# Patient Record
Sex: Male | Born: 1975 | Race: White | Hispanic: No | Marital: Single | Smoking: Current some day smoker
Health system: Southern US, Community
[De-identification: ages and names within clinical notes are randomized; demographics above are authoritative.]

## PROBLEM LIST (undated history)

## (undated) DIAGNOSIS — M549 Dorsalgia, unspecified: Secondary | ICD-10-CM

## (undated) DIAGNOSIS — D1809 Hemangioma of other sites: Secondary | ICD-10-CM

## (undated) DIAGNOSIS — G43909 Migraine, unspecified, not intractable, without status migrainosus: Secondary | ICD-10-CM

## (undated) DIAGNOSIS — E7800 Pure hypercholesterolemia, unspecified: Secondary | ICD-10-CM

## (undated) DIAGNOSIS — D447 Neoplasm of uncertain behavior of aortic body and other paraganglia: Secondary | ICD-10-CM

## (undated) DIAGNOSIS — I10 Essential (primary) hypertension: Secondary | ICD-10-CM

## (undated) DIAGNOSIS — E119 Type 2 diabetes mellitus without complications: Secondary | ICD-10-CM

## (undated) DIAGNOSIS — Z91148 Patient's other noncompliance with medication regimen for other reason: Secondary | ICD-10-CM

## (undated) DIAGNOSIS — Z9114 Patient's other noncompliance with medication regimen: Secondary | ICD-10-CM

## (undated) DIAGNOSIS — F1110 Opioid abuse, uncomplicated: Secondary | ICD-10-CM

## (undated) DIAGNOSIS — F1910 Other psychoactive substance abuse, uncomplicated: Secondary | ICD-10-CM

## (undated) HISTORY — PX: OTHER SURGICAL HISTORY: SHX169

## (undated) HISTORY — DX: Essential (primary) hypertension: I10

## (undated) HISTORY — DX: Type 2 diabetes mellitus without complications: E11.9

## (undated) HISTORY — DX: Migraine, unspecified, not intractable, without status migrainosus: G43.909

## (undated) HISTORY — PX: INNER EAR SURGERY: SHX679

## (undated) HISTORY — DX: Pure hypercholesterolemia, unspecified: E78.00

---

## 1997-12-16 ENCOUNTER — Encounter (HOSPITAL_COMMUNITY): Admission: RE | Admit: 1997-12-16 | Discharge: 1998-03-16 | Payer: Self-pay

## 2010-04-09 ENCOUNTER — Emergency Department (HOSPITAL_COMMUNITY)
Admission: EM | Admit: 2010-04-09 | Discharge: 2010-04-09 | Payer: Self-pay | Source: Home / Self Care | Admitting: Emergency Medicine

## 2010-07-06 LAB — DIFFERENTIAL
Basophils Absolute: 0.1 10*3/uL (ref 0.0–0.1)
Eosinophils Relative: 0 % (ref 0–5)
Lymphocytes Relative: 9 % — ABNORMAL LOW (ref 12–46)
Neutro Abs: 12.1 10*3/uL — ABNORMAL HIGH (ref 1.7–7.7)
Neutrophils Relative %: 75 % (ref 43–77)

## 2010-07-06 LAB — URINE CULTURE: Culture  Setup Time: 201112152213

## 2010-07-06 LAB — STREP A DNA PROBE: Group A Strep Probe: NEGATIVE

## 2010-07-06 LAB — BASIC METABOLIC PANEL
CO2: 26 mEq/L (ref 19–32)
Chloride: 107 mEq/L (ref 96–112)
Glucose, Bld: 111 mg/dL — ABNORMAL HIGH (ref 70–99)
Potassium: 3.7 mEq/L (ref 3.5–5.1)
Sodium: 141 mEq/L (ref 135–145)

## 2010-07-06 LAB — URINALYSIS, ROUTINE W REFLEX MICROSCOPIC
Ketones, ur: NEGATIVE mg/dL
Leukocytes, UA: NEGATIVE
Nitrite: NEGATIVE
Protein, ur: 30 mg/dL — AB
pH: 7 (ref 5.0–8.0)

## 2010-07-06 LAB — CBC
HCT: 46.1 % (ref 39.0–52.0)
Hemoglobin: 16 g/dL (ref 13.0–17.0)
MCV: 91.8 fL (ref 78.0–100.0)
Platelets: 261 10*3/uL (ref 150–400)
RDW: 12.5 % (ref 11.5–15.5)

## 2010-07-06 LAB — URINE MICROSCOPIC-ADD ON

## 2010-07-06 LAB — RAPID STREP SCREEN (MED CTR MEBANE ONLY): Streptococcus, Group A Screen (Direct): NEGATIVE

## 2011-03-24 ENCOUNTER — Emergency Department (HOSPITAL_COMMUNITY)
Admission: EM | Admit: 2011-03-24 | Discharge: 2011-03-24 | Disposition: A | Discharge status: Home / Self Care | Payer: Self-pay | Attending: Emergency Medicine | Admitting: Emergency Medicine

## 2011-03-24 ENCOUNTER — Encounter: Payer: Self-pay | Admitting: *Deleted

## 2011-03-24 DIAGNOSIS — M545 Low back pain, unspecified: Secondary | ICD-10-CM | POA: Insufficient documentation

## 2011-03-24 DIAGNOSIS — F17200 Nicotine dependence, unspecified, uncomplicated: Secondary | ICD-10-CM | POA: Insufficient documentation

## 2011-03-24 DIAGNOSIS — M79604 Pain in right leg: Secondary | ICD-10-CM

## 2011-03-24 MED ORDER — CYCLOBENZAPRINE HCL 10 MG PO TABS
10.0000 mg | ORAL_TABLET | Freq: Once | ORAL | Status: AC
  Administered 2011-03-24: 10 mg via ORAL
  Filled 2011-03-24: qty 1

## 2011-03-24 MED ORDER — OXYCODONE-ACETAMINOPHEN 5-325 MG PO TABS
ORAL_TABLET | ORAL | Status: DC
Start: 2011-03-24 — End: 2011-07-20

## 2011-03-24 MED ORDER — IBUPROFEN 800 MG PO TABS: 800.0000 mg | ORAL_TABLET | Freq: Three times a day (TID) | ORAL | Status: DC

## 2011-03-24 MED ORDER — CYCLOBENZAPRINE HCL 10 MG PO TABS: 10.0000 mg | ORAL_TABLET | Freq: Two times a day (BID) | ORAL | Status: DC | PRN

## 2011-03-24 MED ORDER — OXYCODONE-ACETAMINOPHEN 5-325 MG PO TABS
1.0000 | ORAL_TABLET | Freq: Once | ORAL | Status: AC
  Administered 2011-03-24: 1 via ORAL
  Filled 2011-03-24: qty 1

## 2011-03-24 MED ORDER — IBUPROFEN 800 MG PO TABS
800.0000 mg | ORAL_TABLET | Freq: Once | ORAL | Status: AC
  Administered 2011-03-24: 800 mg via ORAL
  Filled 2011-03-24: qty 1

## 2011-03-24 NOTE — ED Notes (Signed)
Pt states "got in the shower & slipped this a.m., I've had back problems before, the pain is shooting down both my legs"; pt denies urinary/bowel incontinence

## 2011-03-24 NOTE — ED Provider Notes (Signed)
History     CSN: [id number] Arrival date & time: 03/24/2011  6:38 PM   First MD Initiated Contact with Patient 03/24/11 2104      Chief Complaint  Patient presents with  . Back Pain    (Consider location/radiation/quality/duration/timing/severity/associated sxs/prior treatment) Patient is a 35 y.o. male presenting with back pain. The history is provided by the patient.  Back Pain  This is a new problem. The problem occurs constantly. Associated with: He slipped in the shower this morning catching himself before a fall, jarring his back. He has had significant pain since that time to right lower back. The pain is present in the lumbar spine. The quality of the pain is described as stabbing and shooting. The pain radiates to the right thigh. The pain is moderate. The symptoms are aggravated by bending, twisting and certain positions. Pertinent negatives include no fever, no abdominal pain, no bowel incontinence, no bladder incontinence, no paresthesias and no weakness.    History reviewed. No pertinent past medical history.  Past Surgical History  Procedure Date  . Brain tumor removal     No family history on file.  History  Substance Use Topics  . Smoking status: Current Everyday Smoker -- 1.0 packs/day  . Smokeless tobacco: Not on file  . Alcohol Use: Yes     socially      Review of Systems  Constitutional: Negative for fever and chills.  HENT: Negative.   Respiratory: Negative.   Cardiovascular: Negative.   Gastrointestinal: Negative.  Negative for abdominal pain and bowel incontinence.  Genitourinary: Negative for bladder incontinence.  Musculoskeletal: Positive for back pain.       See HPI  Skin: Negative.   Neurological: Negative.  Negative for weakness and paresthesias.    Allergies  Review of patient's allergies indicates no known allergies.  Home Medications   Current Outpatient Rx  Name Route Sig Dispense Refill  . IBUPROFEN 100 MG PO TABS Oral Take  600 mg by mouth every 6 (six) hours as needed.        BP 166/91  Pulse 84  Temp(Src) 98.7 F (37.1 C) (Oral)  Resp 20  Wt 254 lb 6.6 oz (115.4 kg)  SpO2 95%  Physical Exam  Constitutional: He is oriented to person, place, and time. He appears well-developed and well-nourished.  Neck: Normal range of motion.  Pulmonary/Chest: Effort normal.  Abdominal: Soft. He exhibits no mass. There is no tenderness.  Musculoskeletal: Normal range of motion.       Right paralumbar tenderness without swelling, discoloration. No sciatic tenderness on right. Distal pulses 2+.  Neurological: He is alert and oriented to person, place, and time. He has normal reflexes. No sensory deficit.  Skin: Skin is warm and dry.  Psychiatric: He has a normal mood and affect.    ED Course  Procedures (including critical care time)  Labs Reviewed - No data to display No results found.   No diagnosis found.    MDM          Rodena Medin, PA 03/24/11 2148

## 2011-03-24 NOTE — ED Notes (Signed)
Patient stable upon discharge and discharged to care of family.

## 2011-03-25 NOTE — ED Provider Notes (Signed)
Medical screening examination/treatment/procedure(s) were performed by non-physician practitioner and as supervising physician I was immediately available for consultation/collaboration.  Cyndra Numbers, MD 03/25/11 [phone]

## 2011-03-27 ENCOUNTER — Emergency Department (HOSPITAL_COMMUNITY)
Admission: EM | Admit: 2011-03-27 | Discharge: 2011-03-28 | Disposition: A | Discharge status: Home / Self Care | Payer: Self-pay | Attending: Emergency Medicine | Admitting: Emergency Medicine

## 2011-03-27 ENCOUNTER — Encounter (HOSPITAL_COMMUNITY): Payer: Self-pay | Admitting: Emergency Medicine

## 2011-03-27 DIAGNOSIS — K5900 Constipation, unspecified: Secondary | ICD-10-CM | POA: Insufficient documentation

## 2011-03-27 DIAGNOSIS — W19XXXA Unspecified fall, initial encounter: Secondary | ICD-10-CM | POA: Insufficient documentation

## 2011-03-27 DIAGNOSIS — R5381 Other malaise: Secondary | ICD-10-CM | POA: Insufficient documentation

## 2011-03-27 DIAGNOSIS — R209 Unspecified disturbances of skin sensation: Secondary | ICD-10-CM | POA: Insufficient documentation

## 2011-03-27 DIAGNOSIS — M545 Low back pain, unspecified: Secondary | ICD-10-CM | POA: Insufficient documentation

## 2011-03-27 DIAGNOSIS — R5383 Other fatigue: Secondary | ICD-10-CM | POA: Insufficient documentation

## 2011-03-27 HISTORY — DX: Dorsalgia, unspecified: M54.9

## 2011-03-27 NOTE — ED Notes (Signed)
Pt reports low back pain radiating to B/L legs. Pt seen at [facility] for same on 03/24/2011. Prescribed meds not working. Pt reports swelling to left side of back.

## 2011-03-28 ENCOUNTER — Emergency Department (HOSPITAL_COMMUNITY): Payer: Self-pay

## 2011-03-28 MED ORDER — SODIUM CHLORIDE 0.9 % IV SOLN
Freq: Once | INTRAVENOUS | Status: AC
  Administered 2011-03-28: 01:00:00 via INTRAVENOUS

## 2011-03-28 MED ORDER — HYDROMORPHONE HCL PF 1 MG/ML IJ SOLN
0.5000 mg | Freq: Once | INTRAMUSCULAR | Status: AC
  Administered 2011-03-28: 01:00:00 via INTRAVENOUS
  Filled 2011-03-28: qty 1

## 2011-03-28 MED ORDER — KETOROLAC TROMETHAMINE 30 MG/ML IJ SOLN
30.0000 mg | Freq: Once | INTRAMUSCULAR | Status: AC
  Administered 2011-03-28: 30 mg via INTRAVENOUS
  Filled 2011-03-28: qty 1

## 2011-03-28 MED ORDER — DIAZEPAM 5 MG/ML IJ SOLN
5.0000 mg | Freq: Once | INTRAMUSCULAR | Status: AC
  Administered 2011-03-28: 01:00:00 via INTRAVENOUS
  Filled 2011-03-28: qty 2

## 2011-03-28 MED ORDER — DIAZEPAM 5 MG PO TABS: 5.0000 mg | ORAL_TABLET | Freq: Two times a day (BID) | ORAL | Status: AC

## 2011-03-28 MED ORDER — PREDNISONE 10 MG PO TABS: 20.0000 mg | ORAL_TABLET | Freq: Every day | ORAL | Status: AC

## 2011-03-28 NOTE — ED Provider Notes (Signed)
Medical screening examination/treatment/procedure(s) were performed by non-physician practitioner and as supervising physician I was immediately available for consultation/collaboration.  Zayyan Mullen, MD 03/28/11 0823 

## 2011-03-28 NOTE — ED Provider Notes (Signed)
History     CSN: [id number] Arrival date & time: 03/27/2011  7:05 PM   First MD Initiated Contact with Patient 03/27/11 2357      Chief Complaint  Patient presents with  . Back Pain    (Consider location/radiation/quality/duration/timing/severity/associated sxs/prior treatment) HPI Comments: This patient fell 3 days ago, was seen in the emergency room after the fall.  Prescribed Flexeril, Vicodin, and ibuprofen and has not had any relief of his discomfort.  States now the pain is shooting across bilaterally, with numbness and tingling into his legs.  Also reports constipation since starting the Flexeril and Vicodin.  No difficulty urinating  Patient is a 35 y.o. male presenting with back pain. The history is provided by the patient.  Back Pain  This is a recurrent problem. The current episode started more than 2 days ago. The problem occurs constantly. The problem has not changed since onset.The pain is associated with falling. The pain is present in the lumbar spine. The quality of the pain is described as aching. The pain radiates to the left thigh and right thigh. The pain is at a severity of 8/10. The pain is moderate. The symptoms are aggravated by certain positions. Associated symptoms include leg pain. Pertinent negatives include no bowel incontinence, no dysuria and no paresthesias. He has tried NSAIDs, analgesics and muscle relaxants for the symptoms. The treatment provided no relief.    Past Medical History  Diagnosis Date  . Back pain     Past Surgical History  Procedure Date  . Brain tumor removal     History reviewed. No pertinent family history.  History  Substance Use Topics  . Smoking status: Current Everyday Smoker -- 1.0 packs/day  . Smokeless tobacco: Not on file  . Alcohol Use: Yes     socially      Review of Systems  Constitutional: Positive for activity change.  HENT: Negative.   Eyes: Negative.   Respiratory: Negative.   Cardiovascular: Negative.    Gastrointestinal: Negative for diarrhea, constipation and bowel incontinence.  Genitourinary: Negative for dysuria.  Musculoskeletal: Positive for back pain.  Neurological: Negative for dizziness and paresthesias.  Hematological: Negative.   Psychiatric/Behavioral: Negative.     Allergies  Review of patient's allergies indicates no known allergies.  Home Medications   Current Outpatient Rx  Name Route Sig Dispense Refill  . OXYCODONE-ACETAMINOPHEN 5-325 MG PO TABS  One to two tablets q 4-6 hours prn pain 20 tablet 0  . DIAZEPAM 5 MG PO TABS Oral Take 1 tablet (5 mg total) by mouth 2 (two) times daily. 10 tablet 0  . PREDNISONE 10 MG PO TABS Oral Take 2 tablets (20 mg total) by mouth daily. 15 tablet 0    BP 149/102  Pulse 83  Temp(Src) 97.9 F (36.6 C) (Oral)  Resp 18  SpO2 94%  Physical Exam  ED Course  Procedures (including critical care time)  Labs Reviewed - No data to display Dg Lumbar Spine Complete  03/28/2011  *RADIOLOGY REPORT*  Clinical Data: Back pain after fall.  LUMBAR SPINE - COMPLETE 4+ VIEW  Comparison:  None.  Findings:  There is no evidence of lumbar spine fracture. Alignment is normal.  Intervertebral disc spaces are maintained.  IMPRESSION: Negative.  Original Report Authenticated By: Britta Mccreedy, M.D.     1. Lumbar back pain     Discussed changing medication importance of followup with orthopedic, Dr. August Saucer been given information to stop Flexeril stop ibuprofen.  Start Valium and prednisone  taper  MDM  Continued low back pain after fall.  Did not have x-ray at the time of injury.  Due to chronicity of symptoms.  Will x-ray.  I also will increase or change.  Pain control and muscle relaxers.  Will give trial dose IV in emergency room        Arman Filter, NP 03/28/11 0045  Arman Filter, NP 03/28/11 0405  Arman Filter, NP 03/28/11 4098

## 2011-06-06 ENCOUNTER — Encounter (HOSPITAL_BASED_OUTPATIENT_CLINIC_OR_DEPARTMENT_OTHER): Payer: Self-pay | Admitting: Emergency Medicine

## 2011-06-06 ENCOUNTER — Emergency Department (HOSPITAL_BASED_OUTPATIENT_CLINIC_OR_DEPARTMENT_OTHER)
Admission: EM | Admit: 2011-06-06 | Discharge: 2011-06-06 | Disposition: A | Discharge status: Home / Self Care | Payer: Self-pay | Attending: Emergency Medicine | Admitting: Emergency Medicine

## 2011-06-06 DIAGNOSIS — F17200 Nicotine dependence, unspecified, uncomplicated: Secondary | ICD-10-CM | POA: Insufficient documentation

## 2011-06-06 DIAGNOSIS — A6000 Herpesviral infection of urogenital system, unspecified: Secondary | ICD-10-CM | POA: Insufficient documentation

## 2011-06-06 DIAGNOSIS — N489 Disorder of penis, unspecified: Secondary | ICD-10-CM | POA: Insufficient documentation

## 2011-06-06 HISTORY — DX: Hemangioma of other sites: D18.09

## 2011-06-06 HISTORY — DX: Neoplasm of uncertain behavior of aortic body and other paraganglia: D44.7

## 2011-06-06 LAB — HIV ANTIBODY (ROUTINE TESTING W REFLEX): HIV: NONREACTIVE

## 2011-06-06 MED ORDER — OXYCODONE-ACETAMINOPHEN 5-325 MG PO TABS: 1.0000 | ORAL_TABLET | ORAL | Status: AC | PRN

## 2011-06-06 MED ORDER — VALACYCLOVIR HCL 1 G PO TABS: 1000.0000 mg | ORAL_TABLET | Freq: Two times a day (BID) | ORAL | Status: AC

## 2011-06-06 NOTE — ED Notes (Signed)
Pt states he has wound on foreskin which seems to form a scab and every time he voids, it comes off and causes pain.  Painful ambulation as well.  Fever x 3 days.  Pt noted some drainage at wound area.

## 2011-06-06 NOTE — ED Provider Notes (Signed)
History     CSN: [id number]  Arrival date & time 06/06/11  4098   First MD Initiated Contact with Patient 06/06/11 [phone]      No chief complaint on file.   (Consider location/radiation/quality/duration/timing/severity/associated sxs/prior treatment) The history is provided by the patient.   is noted painful sores on his penis for the last week. He thought might have been a yeast infection and used topical Monistat with no relief. There's been slight drainage. During this time, he has also run low grade fevers up to 101.5. He's had a mild sore throat. Denies cough or rhinorrhea denies nausea, vomiting, or diarrhea. He has had unprotected sex, but states he has only had sex with one woman. He states that she has had previous partners but has been tested for STDs. His pain is moderate to severe and worse with palpation.  Past Medical History  Diagnosis Date  . Back pain     Past Surgical History  Procedure Date  . Brain tumor removal     No family history on file.  History  Substance Use Topics  . Smoking status: Current Everyday Smoker -- 1.0 packs/day  . Smokeless tobacco: Not on file  . Alcohol Use: Yes     socially      Review of Systems  All other systems reviewed and are negative.    Allergies  Review of patient's allergies indicates no known allergies.  Home Medications   Current Outpatient Rx  Name Route Sig Dispense Refill  . OXYCODONE-ACETAMINOPHEN 5-325 MG PO TABS  One to two tablets q 4-6 hours prn pain 20 tablet 0    There were no vitals taken for this visit.  Physical Exam  Nursing note and vitals reviewed.  36 year old male who is resting comfortably and in no acute distress. Vital signs are significant for mild hypertension with blood pressure 147/101. Oxygen saturation is 96% which is normal. Head is normocephalic and atraumatic. PERRLA, EOMI. Oropharynx is clear. Neck is nontender and supple without adenopathy or JVD. Lungs are clear without  rales, wheezes, rhonchi. Back is nontender. Heart has regular rate and rhythm without murmur. There is no chest wall tenderness. Abdomen is soft, flat, nontender without masses or hepatosplenomegaly. Genitalia: There are vesicles noted on the prepuce of the penis with some purulent drainage and some mild swelling. There is no urethral discharge and the glans appears normal. There is no inguinal adenopathy. Picture seems most consistent with herpes genitalis. Extremities have no cyanosis or edema, full range of motion present. Skin is otherwise warm and dry without rash. Neurologic: Mental status is normal, cranial nerves are intact, there no focal motor or sensory deficits.  ED Course  Procedures (including critical care time)   1. Herpes genitalis       MDM  Most likely herpes genitalis. Patient doubts this diagnosis because he states his girlfriend has been tested for STDs. I pointed out to them that testing for STDs is not usually involved testing for herpes and that somebody can be infectious without being aware of the infection being present. Cultures have been sent as well as an STD panel. He will be. He be given a prescription for Valtrex and be given a prescription for Percocet for pain. He states that he has been circumcised, but he clearly has skin which goes up over the glans penis. He'll be referred to urology for evaluation as to whether a revision of circumcision would be beneficial.        Onalee Hua  Preston Fleeting, MD 06/06/11 [phone]

## 2011-06-09 LAB — HERPES SIMPLEX VIRUS CULTURE: Culture: DETECTED

## 2011-06-10 NOTE — ED Notes (Signed)
+   Herpes. Patient treated with Valtrex. [facility] faxed.

## 2011-06-11 ENCOUNTER — Ambulatory Visit: Payer: Self-pay | Admitting: Family Medicine

## 2011-06-11 VITALS — BP 146/87 | HR 78 | Temp 97.8°F | Resp 20

## 2011-06-11 DIAGNOSIS — R4781 Slurred speech: Secondary | ICD-10-CM

## 2011-06-11 DIAGNOSIS — E669 Obesity, unspecified: Secondary | ICD-10-CM

## 2011-06-11 DIAGNOSIS — IMO0001 Reserved for inherently not codable concepts without codable children: Secondary | ICD-10-CM

## 2011-06-11 DIAGNOSIS — R739 Hyperglycemia, unspecified: Secondary | ICD-10-CM

## 2011-06-11 DIAGNOSIS — N472 Paraphimosis: Secondary | ICD-10-CM

## 2011-06-11 DIAGNOSIS — G4730 Sleep apnea, unspecified: Secondary | ICD-10-CM

## 2011-06-11 LAB — COMPREHENSIVE METABOLIC PANEL
ALT: 83 U/L — ABNORMAL HIGH (ref 0–53)
AST: 28 U/L (ref 0–37)
Albumin: 4.1 g/dL (ref 3.5–5.2)
Calcium: 9.1 mg/dL (ref 8.4–10.5)
Chloride: 102 mEq/L (ref 96–112)
Creat: 0.65 mg/dL (ref 0.50–1.35)
Potassium: 4 mEq/L (ref 3.5–5.3)

## 2011-06-11 LAB — POCT CBC
Granulocyte percent: 49.6 %G (ref 37–80)
HCT, POC: 46.1 % (ref 43.5–53.7)
MCV: 90.6 fL (ref 80–97)
RBC: 5.09 M/uL (ref 4.69–6.13)

## 2011-06-11 LAB — POCT URINALYSIS DIPSTICK
Glucose, UA: 500
Spec Grav, UA: 1.02

## 2011-06-11 LAB — POCT GLYCOSYLATED HEMOGLOBIN (HGB A1C): Hemoglobin A1C: 9.5

## 2011-06-11 LAB — GLUCOSE, POCT (MANUAL RESULT ENTRY): POC Glucose: 262

## 2011-06-11 MED ORDER — METFORMIN HCL 500 MG PO TABS: ORAL_TABLET | ORAL | Status: DC

## 2011-06-11 MED ORDER — FLUCONAZOLE 100 MG PO TABS: ORAL_TABLET | ORAL | Status: DC

## 2011-06-11 NOTE — ED Notes (Signed)
Patient notified

## 2011-06-11 NOTE — Progress Notes (Signed)
Subjective:    Patient ID: Tyler Green, male    DOB: Jan 31, 1976, 36 y.o.   MRN: [id number]  HPI Today for the first time. He has a family history of diabetes. He has been using his father's glucose meter to check his sugars this week days ago his glucose ranged from the 200s to greater than 400 depending on whether he had eaten or not. He has never been treated for diabetes. Over the past couple of years she apparently has developed more problems. For the past year that has been increasing slurring of his speech. He has been having headaches and dizziness. Headaches primarily frontal.  He has had symptoms of sleep apnea with frequent periods of stopping breathing and his fiance has to waken him up.  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 Saw an oxycodone may have been making his sleep apnea worse.   Review of Systems No other major complaints. No chest pains or breathing problems. History of peripheral from a ladder and having bad ankles.    Objective:   Physical Exam Obese white male in no acute distress. He does speak with a little bit of a slurry, obtunded voice. TMs normal on the left the right is moist head is superior erythema. Apparently he has had surgeries in the past. His throat was clear. Neck supple without significant nodes. Chest is clear to auscultation. Heart regular without a murmurs. Abdomen soft without mass or tenderness. Penis has inflammation on the right side of the glans under the of the remaining foreskin. He has been cervicitis but the skin is still present down over the tip of the penis due to the size of him.  Results for orders placed in visit on 06/11/11  POCT CBC      Component Value Range   WBC 6.7  4.6 - 10.2 (K/uL)   Lymph, poc 2.7  0.6 - 3.4    POC LYMPH PERCENT 41.0  10 - 50 (%L)   MID (cbc) 0.6  0 - 0.9    POC MID % 9.4  0 - 12 (%M)   POC  Granulocyte 3.3  2 - 6.9    Granulocyte percent 49.6  37 - 80 (%G)   RBC 5.09  4.69 - 6.13 (M/uL)   Hemoglobin 15.4  14.1 - 18.1 (g/dL)   HCT, POC 78.2  95.6 - 53.7 (%)   MCV 90.6  80 - 97 (fL)   MCH, POC 30.3  27 - 31.2 (pg)   MCHC 33.4  31.8 - 35.4 (g/dL)   RDW, POC 21.3     Platelet Count, POC 269  142 - 424 (K/uL)   MPV 9.3  0 - 99.8 (fL)  POCT URINALYSIS DIPSTICK      Component Value Range   Color, UA yellow     Clarity, UA clear     Glucose, UA 500     Bilirubin, UA neg     Ketones, UA trace     Spec Grav, UA 1.020     Blood, UA trace     pH, UA 6.5     Protein, UA neg     Urobilinogen, UA 0.2     Nitrite, UA neg     Leukocytes, UA Negative    POCT GLYCOSYLATED HEMOGLOBIN (HGB A1C)      Component Value Range   Hemoglobin A1C 9.5  GLUCOSE, POCT (MANUAL RESULT ENTRY)      Component Value Range   POC Glucose 262         Assessment & Plan:  Hyperglycemia/type 2 diabetes uncontrolled  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 Symptoms of sleep apnea. Obesity Dizziness Headaches  We'll check basic labs and proceed from there.  I will begin treatment for the diabetes. I have explained that although the sleep apnea probably need to work up, we should try and see how he is feeling we get his sugars down slurred speech may need to be worked up also, but will wait on that 2 sisters been on for a long time. Taking the oxycodone for pain, for may be aggravating the sleep apnea symptoms. He does not need to be on any sedatives.

## 2011-06-11 NOTE — Patient Instructions (Addendum)
Read on the American Diabetic Association website about diabetes  Can also use some monistat vaginal or gynelotrimin on penis  Stop taking the oxycodone.

## 2011-06-15 ENCOUNTER — Encounter: Payer: Self-pay | Admitting: Family Medicine

## 2011-07-12 NOTE — ED Provider Notes (Signed)
History     CSN: [id number]  Arrival date & time 06/06/11  4098   First MD Initiated Contact with Patient 06/06/11 [phone]      Chief Complaint  Patient presents with  . Penis Pain  . Fever    (Consider location/radiation/quality/duration/timing/severity/associated sxs/prior treatment) HPI  Past Medical History  Diagnosis Date  . Back pain   . Glomus tumor of middle ear     Past Surgical History  Procedure Date  . Brain tumor removal   . Inner ear surgery     History reviewed. No pertinent family history.  History  Substance Use Topics  . Smoking status: Current Everyday Smoker -- 1.0 packs/day  . Smokeless tobacco: Not on file  . Alcohol Use: Yes     socially      Review of Systems  Allergies  Review of patient's allergies indicates no known allergies.  Home Medications   Current Outpatient Rx  Name Route Sig Dispense Refill  . FLUCONAZOLE 100 MG PO TABS  Use 1 daily for 5 days for yeast infection of penis 5 tablet 0  . METFORMIN HCL 500 MG PO TABS  Take 1 twice daily for 5 days, then 2 twice daily for diabetes. 120 tablet 3  . OXYCODONE-ACETAMINOPHEN 5-325 MG PO TABS  One to two tablets q 4-6 hours prn pain 20 tablet 0    BP 147/101  Pulse 75  Temp(Src) 98 F (36.7 C) (Oral)  Resp 20  SpO2 96%  Physical Exam  ED Course  Procedures (including critical care time)   Labs Reviewed  GC/CHLAMYDIA PROBE AMP, GENITAL  RPR  HIV ANTIBODY (ROUTINE TESTING)  HERPES SIMPLEX VIRUS CULTURE   No results found.   1. Herpes genitalis       MDM   herpes       Arman Filter, NP 07/18/11 2014

## 2011-07-20 ENCOUNTER — Emergency Department (HOSPITAL_BASED_OUTPATIENT_CLINIC_OR_DEPARTMENT_OTHER)
Admission: EM | Admit: 2011-07-20 | Discharge: 2011-07-20 | Disposition: A | Discharge status: Home / Self Care | Payer: Self-pay | Attending: Emergency Medicine | Admitting: Emergency Medicine

## 2011-07-20 ENCOUNTER — Encounter (HOSPITAL_BASED_OUTPATIENT_CLINIC_OR_DEPARTMENT_OTHER): Payer: Self-pay | Admitting: Emergency Medicine

## 2011-07-20 DIAGNOSIS — F17200 Nicotine dependence, unspecified, uncomplicated: Secondary | ICD-10-CM | POA: Insufficient documentation

## 2011-07-20 DIAGNOSIS — M545 Low back pain, unspecified: Secondary | ICD-10-CM | POA: Insufficient documentation

## 2011-07-20 DIAGNOSIS — E119 Type 2 diabetes mellitus without complications: Secondary | ICD-10-CM | POA: Insufficient documentation

## 2011-07-20 DIAGNOSIS — M549 Dorsalgia, unspecified: Secondary | ICD-10-CM

## 2011-07-20 MED ORDER — CYCLOBENZAPRINE HCL 10 MG PO TABS: 10.0000 mg | ORAL_TABLET | Freq: Two times a day (BID) | ORAL | Status: AC | PRN

## 2011-07-20 MED ORDER — KETOROLAC TROMETHAMINE 60 MG/2ML IM SOLN
60.0000 mg | Freq: Once | INTRAMUSCULAR | Status: AC
  Administered 2011-07-20: 60 mg via INTRAMUSCULAR
  Filled 2011-07-20: qty 2

## 2011-07-20 MED ORDER — HYDROCODONE-ACETAMINOPHEN 5-325 MG PO TABS: 2.0000 | ORAL_TABLET | ORAL | Status: AC | PRN

## 2011-07-20 NOTE — ED Provider Notes (Signed)
History     CSN: [id number]  Arrival date & time 07/20/11  Tyler Green   First MD Initiated Contact with Patient 07/20/11 1841      Chief Complaint  Patient presents with  . Back Pain    (Consider location/radiation/quality/duration/timing/severity/associated sxs/prior treatment) Patient is a 36 y.o. male presenting with back pain. The history is provided by the patient. No language interpreter was used.  Back Pain  This is a new problem. The current episode started more than 2 days ago. The problem occurs constantly. The problem has been gradually worsening. The pain is associated with lifting heavy objects. The pain is present in the lumbar spine. The quality of the pain is described as stabbing and shooting. The pain radiates to the left thigh and right thigh. The pain is at a severity of 9/10. The pain is severe. The symptoms are aggravated by bending. The pain is worse during the day. Stiffness is present in the morning. He has tried nothing for the symptoms. The treatment provided no relief.  Pt complains of pain in his low back.  Pt had similar 4 months ago.   Past Medical History  Diagnosis Date  . Back pain   . Glomus tumor of middle ear   . Diabetes mellitus     Past Surgical History  Procedure Date  . Brain tumor removal   . Inner ear surgery     History reviewed. No pertinent family history.  History  Substance Use Topics  . Smoking status: Current Everyday Smoker -- 1.0 packs/day  . Smokeless tobacco: Not on file  . Alcohol Use: Yes     socially      Review of Systems  Musculoskeletal: Positive for back pain.  All other systems reviewed and are negative.    Allergies  Review of patient's allergies indicates no known allergies.  Home Medications   Current Outpatient Rx  Name Route Sig Dispense Refill  . ACETAMINOPHEN 500 MG PO TABS Oral Take 1,000 mg by mouth once as needed. For pain    . SUPER B COMPLEX PO TABS Oral Take 1 tablet by mouth daily.    .  IBUPROFEN 200 MG PO TABS Oral Take 600-800 mg by mouth every 8 (eight) hours as needed. For pain    . METFORMIN HCL 500 MG PO TABS  Take 1 twice daily for 5 days, then 2 twice daily for diabetes. 120 tablet 3    BP 160/88  Pulse 86  Temp(Src) 98.1 F (36.7 C) (Oral)  Resp 16  Ht 5\' 6"  (1.676 m)  Wt 220 lb (99.791 kg)  BMI 35.51 kg/m2  SpO2 98%  Physical Exam  Nursing note and vitals reviewed. Constitutional: He is oriented to person, place, and time. He appears well-developed and well-nourished.  HENT:  Head: Normocephalic and atraumatic.  Right Ear: External ear normal.  Left Ear: External ear normal.  Nose: Nose normal.  Mouth/Throat: Oropharynx is clear and moist.  Eyes: Conjunctivae are normal. Pupils are equal, round, and reactive to light.  Neck: Normal range of motion. Neck supple.  Cardiovascular: Normal rate.   Pulmonary/Chest: Effort normal.  Abdominal: Soft.  Musculoskeletal: Normal range of motion.       Tender ls spine,  Decreased range of motion  Neurological: He is alert and oriented to person, place, and time. He has normal reflexes.  Skin: Skin is warm.  Psychiatric: He has a normal mood and affect.    ED Course  Procedures (including critical care time)  Labs Reviewed - No data to display No results found.   No diagnosis found.    MDM  Rx for vicodin and flexeril.  Referral to Dr. Pearletha Forge and hodgin       Tyler Green Tyler Green, [place] 07/20/11 Tyler Green

## 2011-07-20 NOTE — ED Notes (Signed)
Lower back pain since Friday.  Pt states he lifted furniture.  Pain radiates down legs, no elimination problems.

## 2011-07-20 NOTE — Discharge Instructions (Signed)
Back Pain, Adult Low back pain is very common. About 1 in 5 people have back pain.The cause of low back pain is rarely dangerous. The pain often gets better over time.About half of people with a sudden onset of back pain feel better in just 2 weeks. About 8 in 10 people feel better by 6 weeks.  CAUSES Some common causes of back pain include:  Strain of the muscles or ligaments supporting the spine.   Wear and tear (degeneration) of the spinal discs.   Arthritis.   Direct injury to the back.  DIAGNOSIS Most of the time, the direct cause of low back pain is not known.However, back pain can be treated effectively even when the exact cause of the pain is unknown.Answering your caregiver's questions about your overall health and symptoms is one of the most accurate ways to make sure the cause of your pain is not dangerous. If your caregiver needs more information, he or she may order lab work or imaging tests (X-rays or MRIs).However, even if imaging tests show changes in your back, this usually does not require surgery. HOME CARE INSTRUCTIONS For many people, back pain returns.Since low back pain is rarely dangerous, it is often a condition that people can learn to manageon their own.   Remain active. It is stressful on the back to sit or stand in one place. Do not sit, drive, or stand in one place for more than 30 minutes at a time. Take short walks on level surfaces as soon as pain allows.Try to increase the length of time you walk each day.   Do not stay in bed.Resting more than 1 or 2 days can delay your recovery.   Do not avoid exercise or work.Your body is made to move.It is not dangerous to be active, even though your back may hurt.Your back will likely heal faster if you return to being active before your pain is gone.   Pay attention to your body when you bend and lift. Many people have less discomfortwhen lifting if they bend their knees, keep the load close to their  bodies,and avoid twisting. Often, the most comfortable positions are those that put less stress on your recovering back.   Find a comfortable position to sleep. Use a firm mattress and lie on your side with your knees slightly bent. If you lie on your back, put a pillow under your knees.   Only take over-the-counter or prescription medicines as directed by your caregiver. Over-the-counter medicines to reduce pain and inflammation are often the most helpful.Your caregiver may prescribe muscle relaxant drugs.These medicines help dull your pain so you can more quickly return to your normal activities and healthy exercise.   Put ice on the injured area.   Put ice in a plastic bag.   Place a towel between your skin and the bag.   Leave the ice on for 15 to 20 minutes, 3 to 4 times a day for the first 2 to 3 days. After that, ice and heat may be alternated to reduce pain and spasms.   Ask your caregiver about trying back exercises and gentle massage. This may be of some benefit.   Avoid feeling anxious or stressed.Stress increases muscle tension and can worsen back pain.It is important to recognize when you are anxious or stressed and learn ways to manage it.Exercise is a great option.  SEEK MEDICAL CARE IF:  You have pain that is not relieved with rest or medicine.   You have   pain that does not improve in 1 week.   You have new symptoms.   You are generally not feeling well.  SEEK IMMEDIATE MEDICAL CARE IF:   You have pain that radiates from your back into your legs.   You develop new bowel or bladder control problems.   You have unusual weakness or numbness in your arms or legs.   You develop nausea or vomiting.   You develop abdominal pain.   You feel faint.  Document Released: 04/12/2005 Document Revised: 04/01/2011 Document Reviewed: 08/31/2010 [employer] Patient Information 2012 [employer]. 

## 2011-07-21 NOTE — ED Provider Notes (Signed)
Medical screening examination/treatment/procedure(s) were performed by non-physician practitioner and as supervising physician I was immediately available for consultation/collaboration.   Dione Booze, MD 07/21/11 1421

## 2011-07-21 NOTE — ED Provider Notes (Signed)
Medical screening examination/treatment/procedure(s) were performed by non-physician practitioner and as supervising physician I was immediately available for consultation/collaboration.   Jazon Jipson, MD 07/21/11 0846 

## 2011-11-09 ENCOUNTER — Other Ambulatory Visit: Payer: Self-pay | Admitting: Family Medicine

## 2011-12-23 ENCOUNTER — Other Ambulatory Visit: Payer: Self-pay | Admitting: Physician Assistant

## 2012-01-10 ENCOUNTER — Telehealth: Payer: Self-pay

## 2012-01-10 MED ORDER — METFORMIN HCL 500 MG PO TABS: 1000.0000 mg | ORAL_TABLET | Freq: Two times a day (BID) | ORAL | Status: DC

## 2012-01-10 NOTE — Telephone Encounter (Signed)
The patient's wife called because Tyler Green is about to run out of his metformin rx and he does not have money to come in for office visit per his pharmacy's instructions.  They are wondering what the next step should be, since he does need his medication, but cannot afford an office visit at this time.  Please call the patient or the patient's wife at [phone].

## 2012-01-10 NOTE — Telephone Encounter (Signed)
I spoke to his wife she advised they can not come in and will not be able to come in for some time, he is working at [employer] and does not make enough money for medical care. She is out of work on disability. They are aware he needs to be seen but do not know when he will be able to come in.

## 2012-01-10 NOTE — Telephone Encounter (Signed)
Patient notified

## 2012-01-10 NOTE — Telephone Encounter (Signed)
LMOM to CB. 

## 2012-01-10 NOTE — Telephone Encounter (Signed)
I called wife to determine when will he be able to come in to the office? He was advised last month of the need for office visit. I called the # given but did not get answer. Will try again later.

## 2012-01-10 NOTE — Telephone Encounter (Signed)
Please advise patient that I've sent in a 61-month supply with a refill.  However, he needs to be seen (last seen 05/2011) for any more.  He can set up a payment agreement or contact the [facility] [phone].

## 2012-06-25 ENCOUNTER — Encounter (HOSPITAL_BASED_OUTPATIENT_CLINIC_OR_DEPARTMENT_OTHER): Payer: Self-pay

## 2012-06-25 ENCOUNTER — Emergency Department (HOSPITAL_BASED_OUTPATIENT_CLINIC_OR_DEPARTMENT_OTHER)
Admission: EM | Admit: 2012-06-25 | Discharge: 2012-06-25 | Disposition: A | Discharge status: Home / Self Care | Payer: BC Managed Care – PPO | Attending: Emergency Medicine | Admitting: Emergency Medicine

## 2012-06-25 DIAGNOSIS — Y93G3 Activity, cooking and baking: Secondary | ICD-10-CM | POA: Insufficient documentation

## 2012-06-25 DIAGNOSIS — T2010XA Burn of first degree of head, face, and neck, unspecified site, initial encounter: Secondary | ICD-10-CM | POA: Insufficient documentation

## 2012-06-25 DIAGNOSIS — Z8669 Personal history of other diseases of the nervous system and sense organs: Secondary | ICD-10-CM | POA: Insufficient documentation

## 2012-06-25 DIAGNOSIS — T22119A Burn of first degree of unspecified forearm, initial encounter: Secondary | ICD-10-CM | POA: Insufficient documentation

## 2012-06-25 DIAGNOSIS — Z79899 Other long term (current) drug therapy: Secondary | ICD-10-CM | POA: Insufficient documentation

## 2012-06-25 DIAGNOSIS — E119 Type 2 diabetes mellitus without complications: Secondary | ICD-10-CM | POA: Insufficient documentation

## 2012-06-25 DIAGNOSIS — T2026XA Burn of second degree of forehead and cheek, initial encounter: Secondary | ICD-10-CM | POA: Insufficient documentation

## 2012-06-25 DIAGNOSIS — F17200 Nicotine dependence, unspecified, uncomplicated: Secondary | ICD-10-CM | POA: Insufficient documentation

## 2012-06-25 DIAGNOSIS — Z8739 Personal history of other diseases of the musculoskeletal system and connective tissue: Secondary | ICD-10-CM | POA: Insufficient documentation

## 2012-06-25 DIAGNOSIS — T2027XA Burn of second degree of neck, initial encounter: Secondary | ICD-10-CM | POA: Insufficient documentation

## 2012-06-25 DIAGNOSIS — Y9289 Other specified places as the place of occurrence of the external cause: Secondary | ICD-10-CM | POA: Insufficient documentation

## 2012-06-25 DIAGNOSIS — T2000XA Burn of unspecified degree of head, face, and neck, unspecified site, initial encounter: Secondary | ICD-10-CM

## 2012-06-25 DIAGNOSIS — X19XXXA Contact with other heat and hot substances, initial encounter: Secondary | ICD-10-CM | POA: Insufficient documentation

## 2012-06-25 MED ORDER — OXYCODONE-ACETAMINOPHEN 5-325 MG PO TABS: 2.0000 | ORAL_TABLET | ORAL | Status: DC | PRN

## 2012-06-25 MED ORDER — BACITRACIN ZINC 500 UNIT/GM EX OINT
TOPICAL_OINTMENT | Freq: Two times a day (BID) | CUTANEOUS | Status: DC
  Administered 2012-06-25: 1 via TOPICAL
  Filled 2012-06-25: qty 15

## 2012-06-25 MED ORDER — OXYCODONE-ACETAMINOPHEN 5-325 MG PO TABS
2.0000 | ORAL_TABLET | Freq: Once | ORAL | Status: AC
  Administered 2012-06-25: 2 via ORAL
  Filled 2012-06-25 (×2): qty 2
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
## 2012-06-28 ENCOUNTER — Encounter (HOSPITAL_BASED_OUTPATIENT_CLINIC_OR_DEPARTMENT_OTHER): Payer: Self-pay | Admitting: *Deleted

## 2012-06-28 ENCOUNTER — Emergency Department (HOSPITAL_BASED_OUTPATIENT_CLINIC_OR_DEPARTMENT_OTHER)
Admission: EM | Admit: 2012-06-28 | Discharge: 2012-06-28 | Disposition: A | Discharge status: Home / Self Care | Payer: BC Managed Care – PPO | Attending: Emergency Medicine | Admitting: Emergency Medicine

## 2012-06-28 DIAGNOSIS — E119 Type 2 diabetes mellitus without complications: Secondary | ICD-10-CM | POA: Insufficient documentation

## 2012-06-28 DIAGNOSIS — Z87828 Personal history of other (healed) physical injury and trauma: Secondary | ICD-10-CM | POA: Insufficient documentation

## 2012-06-28 DIAGNOSIS — Z79899 Other long term (current) drug therapy: Secondary | ICD-10-CM | POA: Insufficient documentation

## 2012-06-28 DIAGNOSIS — R51 Headache: Secondary | ICD-10-CM | POA: Insufficient documentation

## 2012-06-28 DIAGNOSIS — Z8669 Personal history of other diseases of the nervous system and sense organs: Secondary | ICD-10-CM | POA: Insufficient documentation

## 2012-06-28 DIAGNOSIS — F17200 Nicotine dependence, unspecified, uncomplicated: Secondary | ICD-10-CM | POA: Insufficient documentation

## 2012-06-28 DIAGNOSIS — Z4800 Encounter for change or removal of nonsurgical wound dressing: Secondary | ICD-10-CM | POA: Insufficient documentation

## 2012-06-28 DIAGNOSIS — Z8739 Personal history of other diseases of the musculoskeletal system and connective tissue: Secondary | ICD-10-CM | POA: Insufficient documentation

## 2012-06-28 MED ORDER — NAPROXEN 500 MG PO TABS: 500.0000 mg | ORAL_TABLET | Freq: Two times a day (BID) | ORAL | Status: DC

## 2012-06-28 MED ORDER — HYDROCODONE-ACETAMINOPHEN 5-325 MG PO TABS: 1.0000 | ORAL_TABLET | Freq: Four times a day (QID) | ORAL | Status: DC | PRN
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
## 2012-11-11 ENCOUNTER — Ambulatory Visit (INDEPENDENT_AMBULATORY_CARE_PROVIDER_SITE_OTHER): Payer: BC Managed Care – PPO | Admitting: Family Medicine

## 2012-11-11 VITALS — BP 120/80 | HR 79 | Temp 98.0°F | Resp 16 | Ht 66.0 in | Wt 179.0 lb

## 2012-11-11 DIAGNOSIS — M5431 Sciatica, right side: Secondary | ICD-10-CM

## 2012-11-11 DIAGNOSIS — M5416 Radiculopathy, lumbar region: Secondary | ICD-10-CM

## 2012-11-11 DIAGNOSIS — IMO0002 Reserved for concepts with insufficient information to code with codable children: Secondary | ICD-10-CM

## 2012-11-11 DIAGNOSIS — M5430 Sciatica, unspecified side: Secondary | ICD-10-CM

## 2012-11-11 MED ORDER — MELOXICAM 15 MG PO TABS: 15.0000 mg | ORAL_TABLET | Freq: Every day | ORAL | Status: DC

## 2012-11-11 MED ORDER — CYCLOBENZAPRINE HCL 10 MG PO TABS: 10.0000 mg | ORAL_TABLET | Freq: Three times a day (TID) | ORAL | Status: DC | PRN

## 2012-11-11 MED ORDER — HYDROCODONE-ACETAMINOPHEN 5-325 MG PO TABS: 1.0000 | ORAL_TABLET | Freq: Four times a day (QID) | ORAL | Status: DC | PRN

## 2012-11-11 NOTE — Progress Notes (Signed)
Subjective:    Patient ID: Tyler Green, male    DOB: 1975-12-27, 37 y.o.   MRN: [id number] Chief Complaint  Patient presents with  . Back Pain   HPI Pulled a lot of weeds yesterday.  Then spent the night sleeping on the couch and woke with severe back pain and stiffness shooting down right leg - worse in R > L but does seems to be going down both legs.  Has had back problems prior - slipped in the shower last yr and a few work injuries prior to that.  Always resolved w/ conservative treatment - anti-inflammatory, pain med, and ibuprofen and usu resolves w/in 2 wks.  Did have to do some PT last yr after fall in shower.   No numbness or weakness but pain is limiting his function.  No changes in bowels/bladder. No f/c. Took ibuprofen 400mg  po x 1 today w/o relief.  Just started following up with a new N.P. to care for his DM at [facility] and just got restarted on metformin - supposed to be doing blood glucose log now but hasn't been checking cbgs.  Past Medical History  Diagnosis Date  . Back pain   . Glomus tumor of middle ear   . Diabetes mellitus    Current Outpatient Prescriptions on File Prior to Visit  Medication Sig Dispense Refill  . acetaminophen (TYLENOL) 500 MG tablet Take 1,000 mg by mouth once as needed. For pain      . B Complex-C (SUPER B COMPLEX) TABS Take 1 tablet by mouth daily.      Marland Kitchen ibuprofen (ADVIL,MOTRIN) 200 MG tablet Take 600-800 mg by mouth every 8 (eight) hours as needed. For pain      . metFORMIN (GLUCOPHAGE) 500 MG tablet Take 2 tablets (1,000 mg total) by mouth 2 (two) times daily with a meal.  120 tablet  1   No current facility-administered medications on file prior to visit.   No Known Allergies  Review of Systems  Constitutional: Negative for fever and chills.  Gastrointestinal: Negative for abdominal pain, diarrhea and constipation.  Genitourinary: Negative for urgency, frequency, decreased urine volume and difficulty  urinating.  Musculoskeletal: Positive for myalgias and back pain. Negative for gait problem.  Skin: Negative for color change and wound.  Neurological: Negative for dizziness, weakness, light-headedness and numbness.      BP 120/80  Pulse 79  Temp(Src) 98 F (36.7 C) (Oral)  Resp 16  Ht 5\' 6"  (1.676 m)  Wt 179 lb (81.194 kg)  BMI 28.91 kg/m2  SpO2 97% Objective:   Physical Exam  Constitutional: He is oriented to person, place, and time. He appears well-developed and well-nourished. No distress.  HENT:  Head: Normocephalic and atraumatic.  Cardiovascular: Intact distal pulses.   Pulmonary/Chest: Effort normal.  Musculoskeletal: He exhibits tenderness. He exhibits no edema.       Thoracic back: He exhibits normal range of motion, no tenderness, no bony tenderness, no swelling, no deformity and no spasm.       Lumbar back: He exhibits decreased range of motion, tenderness, pain and spasm. He exhibits no bony tenderness, no edema and no deformity.  Supine straight leg raise + on the right w/ pain radiating to the right toes  Neurological: He is alert and oriented to person, place, and time. He has normal strength and normal reflexes. He displays no atrophy. No sensory deficit. He exhibits normal muscle tone. Coordination and gait normal.  Reflex Scores:  Patellar reflexes are 2+ on the right side and 2+ on the left side.      Achilles reflexes are 2+ on the right side and 2+ on the left side. Skin: Skin is warm and dry. No rash noted. He is not diaphoretic. No erythema.  Psychiatric: He has a normal mood and affect. His behavior is normal.          Assessment & Plan:  Sciatica, right - No steroids due to uncontrolled DM.  If pain continues, rec RTC to consider xray and PT referral.  Start meloxicam daily and continue until pain is completely resolved along with heat and gentle stretching. Continue with activity and use flexeril and norco prn.  Lumbar radiculopathy,  acute  Meds ordered this encounter  Medications  . HYDROcodone-acetaminophen (NORCO/VICODIN) 5-325 MG per tablet    Sig: Take 1 tablet by mouth every 6 (six) hours as needed for pain.    Dispense:  15 tablet    Refill:  0  . meloxicam (MOBIC) 15 MG tablet    Sig: Take 1 tablet (15 mg total) by mouth daily.    Dispense:  30 tablet    Refill:  1  . cyclobenzaprine (FLEXERIL) 10 MG tablet    Sig: Take 1 tablet (10 mg total) by mouth 3 (three) times daily as needed for muscle spasms.    Dispense:  30 tablet    Refill:  0

## 2012-11-11 NOTE — Patient Instructions (Addendum)

## 2013-01-25 ENCOUNTER — Emergency Department (HOSPITAL_BASED_OUTPATIENT_CLINIC_OR_DEPARTMENT_OTHER)
Admission: EM | Admit: 2013-01-25 | Discharge: 2013-01-25 | Disposition: A | Discharge status: Home / Self Care | Payer: BC Managed Care – PPO | Attending: Emergency Medicine | Admitting: Emergency Medicine

## 2013-01-25 ENCOUNTER — Encounter (HOSPITAL_BASED_OUTPATIENT_CLINIC_OR_DEPARTMENT_OTHER): Payer: Self-pay | Admitting: Emergency Medicine

## 2013-01-25 DIAGNOSIS — R739 Hyperglycemia, unspecified: Secondary | ICD-10-CM

## 2013-01-25 DIAGNOSIS — E119 Type 2 diabetes mellitus without complications: Secondary | ICD-10-CM | POA: Insufficient documentation

## 2013-01-25 DIAGNOSIS — Z791 Long term (current) use of non-steroidal anti-inflammatories (NSAID): Secondary | ICD-10-CM | POA: Insufficient documentation

## 2013-01-25 DIAGNOSIS — F17200 Nicotine dependence, unspecified, uncomplicated: Secondary | ICD-10-CM | POA: Insufficient documentation

## 2013-01-25 DIAGNOSIS — M5412 Radiculopathy, cervical region: Secondary | ICD-10-CM | POA: Insufficient documentation

## 2013-01-25 DIAGNOSIS — Z79899 Other long term (current) drug therapy: Secondary | ICD-10-CM | POA: Insufficient documentation

## 2013-01-25 DIAGNOSIS — Z85858 Personal history of malignant neoplasm of other endocrine glands: Secondary | ICD-10-CM | POA: Insufficient documentation

## 2013-01-25 DIAGNOSIS — M5431 Sciatica, right side: Secondary | ICD-10-CM

## 2013-01-25 HISTORY — DX: Patient's other noncompliance with medication regimen: Z91.14

## 2013-01-25 HISTORY — DX: Patient's other noncompliance with medication regimen for other reason: Z91.148

## 2013-01-25 LAB — BASIC METABOLIC PANEL
BUN: 14 mg/dL (ref 6–23)
Calcium: 9.6 mg/dL (ref 8.4–10.5)
Creatinine, Ser: 0.7 mg/dL (ref 0.50–1.35)
GFR calc Af Amer: >90 mL/min (ref >90)
GFR calc non Af Amer: >90 mL/min (ref >90)
Potassium: 3.9 mEq/L (ref 3.5–5.1)

## 2013-01-25 MED ORDER — HYDROMORPHONE HCL PF 1 MG/ML IJ SOLN
1.0000 mg | Freq: Once | INTRAMUSCULAR | Status: AC
  Administered 2013-01-25: 1 mg via INTRAVENOUS
  Filled 2013-01-25: qty 1

## 2013-01-25 MED ORDER — SODIUM CHLORIDE 0.9 % IV BOLUS (SEPSIS)
1000.0000 mL | Freq: Once | INTRAVENOUS | Status: AC
  Administered 2013-01-25: 1000 mL via INTRAVENOUS

## 2013-01-25 MED ORDER — HYDROCODONE-ACETAMINOPHEN 5-325 MG PO TABS: 1.0000 | ORAL_TABLET | Freq: Four times a day (QID) | ORAL | Status: DC | PRN

## 2013-01-25 MED ORDER — MELOXICAM 15 MG PO TABS: 15.0000 mg | ORAL_TABLET | Freq: Every day | ORAL | Status: DC

## 2013-01-25 MED ORDER — METFORMIN HCL 500 MG PO TABS: 1000.0000 mg | ORAL_TABLET | Freq: Two times a day (BID) | ORAL | Status: DC

## 2013-01-25 MED ORDER — LORAZEPAM 2 MG/ML IJ SOLN
0.5000 mg | Freq: Once | INTRAMUSCULAR | Status: AC
  Administered 2013-01-25: 0.5 mg via INTRAVENOUS
  Filled 2013-01-25: qty 1

## 2013-01-25 MED ORDER — CYCLOBENZAPRINE HCL 10 MG PO TABS: 10.0000 mg | ORAL_TABLET | Freq: Three times a day (TID) | ORAL | Status: DC | PRN

## 2013-01-25 MED ORDER — KETOROLAC TROMETHAMINE 30 MG/ML IJ SOLN
30.0000 mg | Freq: Once | INTRAMUSCULAR | Status: AC
  Administered 2013-01-25: 30 mg via INTRAVENOUS
  Filled 2013-01-25: qty 1

## 2013-01-25 NOTE — ED Notes (Signed)
Pt hyperventilating and writhing around in pain. MD at bedside. Pt c/o burning sensation in right knee. Pt states he is out of vicodin from previous visit for same.

## 2013-01-25 NOTE — ED Provider Notes (Signed)
CSN: [id number]     Arrival date & time 01/25/13  4098 History   First MD Initiated Contact with Patient 01/25/13 0341     Chief Complaint  Patient presents with  . Leg Pain   (Consider location/radiation/quality/duration/timing/severity/associated sxs/prior Treatment) Patient is a 37 y.o. male presenting with leg pain.  Leg Pain  Pt with history of DM and sciatica reports his right lower back has been bothering him since yesterday afternoon at work. About an hour ago while sleeping he was woken up with severe aching pain in R lower back radiating into his R leg. States he is having severe burning pain in knee and calf, waxes and wanes associated with cramping. No falls or injury, no bowel or bladder problems and no fever. He has been taking OTC Potassium supplements recently for muscle cramps but has never had his renal function or potassium levels checked.   Past Medical History  Diagnosis Date  . Back pain   . Glomus tumor of middle ear   . Diabetes mellitus    Past Surgical History  Procedure Laterality Date  . Brain tumor removal    . Inner ear surgery     Family History  Problem Relation Age of Onset  . Diabetes Mother   . Diabetes Father    History  Substance Use Topics  . Smoking status: Current Some Day Smoker -- 0.50 packs/day    Types: Cigarettes  . Smokeless tobacco: Not on file  . Alcohol Use: Yes     Comment: socially    Review of Systems All other systems reviewed and are negative except as noted in HPI.   Allergies  Review of patient's allergies indicates no known allergies.  Home Medications   Current Outpatient Rx  Name  Route  Sig  Dispense  Refill  . acetaminophen (TYLENOL) 500 MG tablet   Oral   Take 1,000 mg by mouth once as needed. For pain         . B Complex-C (SUPER B COMPLEX) TABS   Oral   Take 1 tablet by mouth daily.         . cyclobenzaprine (FLEXERIL) 10 MG tablet   Oral   Take 1 tablet (10 mg total) by mouth 3 (three) times  daily as needed for muscle spasms.   30 tablet   0   . HYDROcodone-acetaminophen (NORCO/VICODIN) 5-325 MG per tablet   Oral   Take 1 tablet by mouth every 6 (six) hours as needed for pain.   15 tablet   0   . ibuprofen (ADVIL,MOTRIN) 200 MG tablet   Oral   Take 600-800 mg by mouth every 8 (eight) hours as needed. For pain         . meloxicam (MOBIC) 15 MG tablet   Oral   Take 1 tablet (15 mg total) by mouth daily.   30 tablet   1   . metFORMIN (GLUCOPHAGE) 500 MG tablet   Oral   Take 2 tablets (1,000 mg total) by mouth 2 (two) times daily with a meal.   120 tablet   1    BP 146/106  Pulse 75  Temp(Src) 97.8 F (36.6 C) (Oral)  Resp 26  SpO2 100% Physical Exam  Nursing note and vitals reviewed. Constitutional: He is oriented to person, place, and time. He appears well-developed and well-nourished.  Writhing in bed, screaming and moaning  HENT:  Head: Normocephalic and atraumatic.  Eyes: EOM are normal. Pupils are equal, round, and  reactive to light.  Neck: Normal range of motion. Neck supple.  Cardiovascular: Normal rate, normal heart sounds and intact distal pulses.   Pulmonary/Chest: Effort normal and breath sounds normal.  Abdominal: Bowel sounds are normal. He exhibits no distension. There is no tenderness.  Musculoskeletal: Normal range of motion. He exhibits tenderness (tender R lower back, no midline tenderness; mild tenderness to R calf where he has some cramping). He exhibits no edema.  Neurological: He is alert and oriented to person, place, and time. He has normal strength. He displays normal reflexes. No cranial nerve deficit or sensory deficit.  Skin: Skin is warm and dry. No rash noted.  Psychiatric: He has a normal mood and affect.    ED Course  Procedures (including critical care time) Labs Review Labs Reviewed  BASIC METABOLIC PANEL - Abnormal; Notable for the following:    Glucose, Bld 475 (*)    All other components within normal limits    Imaging Review No results found.  MDM   1. Sciatica, right   2. Hyperglycemia without ketosis     Hyperglycemia without signs of DKA, renal dysfunction or hyperkalemia. His pain is improved, likely sciatica which has been a chronic problem for him. He is not a candidate for oral steroids due to uncontrolled DM. Will d/c with pain medications, NSAID, muscle relaxer and metformin refill. Advised PCP and Neurosurg eval for his pain.    Kjerstin Abrigo B. Bernette Mayers, MD 01/25/13 [phone]

## 2013-01-25 NOTE — ED Notes (Signed)
Pt laying on back with right leg straight to ceiling bending and straightening leg. No difficulty with right knee or hip ROM noted.

## 2013-01-25 NOTE — ED Notes (Signed)
Pt also reports he is out of his metformin for blood sugar.

## 2013-01-25 NOTE — ED Notes (Signed)
Pt c/o lower back pain which is recurrent starting yesterday and awoke tonight with right leg pain.

## 2014-03-20 ENCOUNTER — Encounter (HOSPITAL_BASED_OUTPATIENT_CLINIC_OR_DEPARTMENT_OTHER): Payer: Self-pay

## 2014-03-20 ENCOUNTER — Emergency Department (HOSPITAL_BASED_OUTPATIENT_CLINIC_OR_DEPARTMENT_OTHER)
Admission: EM | Admit: 2014-03-20 | Discharge: 2014-03-21 | Disposition: A | Discharge status: Home / Self Care | Payer: BC Managed Care – PPO | Attending: Emergency Medicine | Admitting: Emergency Medicine

## 2014-03-20 ENCOUNTER — Emergency Department (HOSPITAL_BASED_OUTPATIENT_CLINIC_OR_DEPARTMENT_OTHER): Payer: BC Managed Care – PPO

## 2014-03-20 DIAGNOSIS — Z791 Long term (current) use of non-steroidal anti-inflammatories (NSAID): Secondary | ICD-10-CM | POA: Insufficient documentation

## 2014-03-20 DIAGNOSIS — W01198A Fall on same level from slipping, tripping and stumbling with subsequent striking against other object, initial encounter: Secondary | ICD-10-CM | POA: Insufficient documentation

## 2014-03-20 DIAGNOSIS — Y998 Other external cause status: Secondary | ICD-10-CM | POA: Diagnosis not present

## 2014-03-20 DIAGNOSIS — E119 Type 2 diabetes mellitus without complications: Secondary | ICD-10-CM | POA: Diagnosis not present

## 2014-03-20 DIAGNOSIS — S20211A Contusion of right front wall of thorax, initial encounter: Secondary | ICD-10-CM | POA: Insufficient documentation

## 2014-03-20 DIAGNOSIS — Z9114 Patient's other noncompliance with medication regimen: Secondary | ICD-10-CM | POA: Insufficient documentation

## 2014-03-20 DIAGNOSIS — Z86018 Personal history of other benign neoplasm: Secondary | ICD-10-CM | POA: Insufficient documentation

## 2014-03-20 DIAGNOSIS — T1490XA Injury, unspecified, initial encounter: Secondary | ICD-10-CM

## 2014-03-20 DIAGNOSIS — Y9289 Other specified places as the place of occurrence of the external cause: Secondary | ICD-10-CM | POA: Diagnosis not present

## 2014-03-20 DIAGNOSIS — Z720 Tobacco use: Secondary | ICD-10-CM | POA: Insufficient documentation

## 2014-03-20 DIAGNOSIS — Y9302 Activity, running: Secondary | ICD-10-CM | POA: Insufficient documentation

## 2014-03-20 DIAGNOSIS — Z79899 Other long term (current) drug therapy: Secondary | ICD-10-CM | POA: Insufficient documentation

## 2014-03-20 DIAGNOSIS — S299XXA Unspecified injury of thorax, initial encounter: Secondary | ICD-10-CM | POA: Diagnosis present

## 2014-03-20 DIAGNOSIS — T148XXA Other injury of unspecified body region, initial encounter: Secondary | ICD-10-CM

## 2014-03-20 MED ORDER — KETOROLAC TROMETHAMINE 60 MG/2ML IM SOLN
60.0000 mg | Freq: Once | INTRAMUSCULAR | Status: AC
  Administered 2014-03-20: 60 mg via INTRAMUSCULAR
  Filled 2014-03-20: qty 2

## 2014-03-20 MED ORDER — MELOXICAM 7.5 MG PO TABS
7.5000 mg | ORAL_TABLET | Freq: Every day | ORAL | Status: DC
Start: 2014-03-20 — End: 2016-01-14

## 2014-03-20 MED ORDER — METHOCARBAMOL 500 MG PO TABS
1000.0000 mg | ORAL_TABLET | Freq: Once | ORAL | Status: AC
  Administered 2014-03-20: 1000 mg via ORAL
  Filled 2014-03-20: qty 2

## 2014-03-20 MED ORDER — METHOCARBAMOL 500 MG PO TABS: 500.0000 mg | ORAL_TABLET | Freq: Two times a day (BID) | ORAL | Status: DC

## 2014-03-20 NOTE — Discharge Instructions (Signed)
Contusion °A contusion is a deep bruise. Contusions happen when an injury causes bleeding under the skin. Signs of bruising include pain, puffiness (swelling), and discolored skin. The contusion may turn blue, purple, or yellow. °HOME CARE  °· Put ice on the injured area. °¨ Put ice in a plastic bag. °¨ Place a towel between your skin and the bag. °¨ Leave the ice on for 15-20 minutes, 03-04 times a day. °· Only take medicine as told by your doctor. °· Rest the injured area. °· If possible, raise (elevate) the injured area to lessen puffiness. °GET HELP RIGHT AWAY IF:  °· You have more bruising or puffiness. °· You have pain that is getting worse. °· Your puffiness or pain is not helped by medicine. °MAKE SURE YOU:  °· Understand these instructions. °· Will watch your condition. °· Will get help right away if you are not doing well or get worse. °Document Released: 09/29/2007 Document Revised: 07/05/2011 Document Reviewed: 02/15/2011 °[employer]® Patient Information ©2015 [employer]. This information is not intended to replace advice given to you by your health care provider. Make sure you discuss any questions you have with your health care provider. ° °

## 2014-03-20 NOTE — ED Notes (Signed)
Tripped fell 11/20-pain to right rib area

## 2014-03-20 NOTE — ED Provider Notes (Signed)
CSN: [id number]     Arrival date & time 03/20/14  2108 History  This chart was scribed for Adaly Puder Alfonso Patten, MD by Einar Pheasant, ED Scribe. This patient was seen in room MH04/MH04 and the patient's care was started at 11:33 PM.    Chief Complaint  Patient presents with  . Fall   Patient is a 38 y.o. male presenting with fall. The history is provided by the patient. No language interpreter was used.  Fall This is a new problem. The current episode started more than 2 days ago. The problem occurs constantly. The problem has not changed since onset.Pertinent negatives include no chest pain, no abdominal pain, no headaches and no shortness of breath. Nothing aggravates the symptoms. Nothing relieves the symptoms. He has tried acetaminophen for the symptoms. The treatment provided no relief.   HPI Comments: Tyler Green is a 38 y.o. male who presents to the Emergency Department complaining of a fall that occurred 5 days ago. Pt states that he was running across the yard when he fell and hit his right side. He is currently experiencing pain to the right rib region. Pt states that he's been tolerating the pain and taking tylenol and Ibuprofen without any significant relief. Denies any medicinal allergies, fever, chills  Past Medical History  Diagnosis Date  . Back pain   . Glomus tumor of middle ear   . Diabetes mellitus   . Non compliance w medication regimen    Past Surgical History  Procedure Laterality Date  . Brain tumor removal    . Inner ear surgery     Family History  Problem Relation Age of Onset  . Diabetes Mother   . Diabetes Father    History  Substance Use Topics  . Smoking status: Current Some Day Smoker -- 0.50 packs/day    Types: Cigarettes  . Smokeless tobacco: Not on file  . Alcohol Use: Yes     Comment: socially    Review of Systems  Respiratory: Negative for shortness of breath.   Cardiovascular: Negative for chest pain, palpitations and leg swelling.   Gastrointestinal: Negative for abdominal pain.  Musculoskeletal: Positive for arthralgias.  Neurological: Negative for headaches.  All other systems reviewed and are negative.     Allergies  Review of patient's allergies indicates no known allergies.  Home Medications   Prior to Admission medications   Medication Sig Start Date End Date Taking? Authorizing Provider  acetaminophen (TYLENOL) 500 MG tablet Take 1,000 mg by mouth once as needed. For pain    Historical Provider, MD  B Complex-C (SUPER B COMPLEX) TABS Take 1 tablet by mouth daily.    Historical Provider, MD  cyclobenzaprine (FLEXERIL) 10 MG tablet Take 1 tablet (10 mg total) by mouth 3 (three) times daily as needed for muscle spasms. 01/25/13   Charles B. Karle Starch, MD  HYDROcodone-acetaminophen (NORCO/VICODIN) 5-325 MG per tablet Take 1 tablet by mouth every 6 (six) hours as needed for pain. 01/25/13   Charles B. Karle Starch, MD  ibuprofen (ADVIL,MOTRIN) 200 MG tablet Take 600-800 mg by mouth every 8 (eight) hours as needed. For pain    Historical Provider, MD  meloxicam (MOBIC) 15 MG tablet Take 1 tablet (15 mg total) by mouth daily. 01/25/13   Charles B. Karle Starch, MD  metFORMIN (GLUCOPHAGE) 500 MG tablet Take 2 tablets (1,000 mg total) by mouth 2 (two) times daily with a meal. 01/25/13   Charles B. Karle Starch, MD   Triage Vitals: BP 178/105 mmHg  Pulse 79  Temp(Src) 99.3 F (37.4 C) (Oral)  Resp 16  Ht 5\' 7"  (1.702 m)  Wt 202 lb (91.627 kg)  BMI 31.63 kg/m2  SpO2 97%  Physical Exam  Constitutional: He is oriented to person, place, and time. He appears well-developed and well-nourished. No distress.  HENT:  Head: Normocephalic and atraumatic.  Right Ear: External ear normal.  Left Ear: External ear normal.  Nose: Nose normal.  Mouth/Throat: Oropharynx is clear and moist. No oropharyngeal exudate.  Eyes: Conjunctivae are normal. Pupils are equal, round, and reactive to light. Right eye exhibits no discharge. Left eye exhibits  no discharge.  Normal appearance  Neck: Normal range of motion. Neck supple. No thyromegaly present.  Cardiovascular: Normal rate, regular rhythm and normal heart sounds.  Exam reveals no gallop and no friction rub.   No murmur heard. Pulmonary/Chest: Effort normal and breath sounds normal. No respiratory distress. He has no wheezes. He has no rales. He exhibits no tenderness.  Abdominal: Soft. Bowel sounds are normal. He exhibits no distension. There is no tenderness. There is no rebound and no guarding.  Musculoskeletal: Normal range of motion. He exhibits no edema or tenderness.  No crepitus. No deformity.  Neurological: He is alert and oriented to person, place, and time.  Skin: Skin is warm and dry.  Psychiatric: He has a normal mood and affect. His behavior is normal. Thought content normal.  Nursing note and vitals reviewed.   ED Course  Procedures (including critical care time)  DIAGNOSTIC STUDIES: Oxygen Saturation is 97% on RA, adequate by my interpretation.    COORDINATION OF CARE: 11:35 PM- Will prescribe muscle relaxant. Pt advised of plan for treatment and pt agrees.   Medications  methocarbamol (ROBAXIN) tablet 1,000 mg (not administered)  ketorolac (TORADOL) injection 60 mg (not administered)     Imaging Review Dg Ribs Unilateral W/chest Right  03/20/2014   CLINICAL DATA:  Right lower rib pain with some SOB after falling on tree stump 4 days ago  EXAM: RIGHT RIBS AND CHEST - 3+ VIEW  COMPARISON:  04/09/2010  FINDINGS: No fracture or other bone lesions are seen involving the ribs. There is no evidence of pneumothorax or pleural effusion. Both lungs are clear. Heart size and mediastinal contours are within normal limits.  IMPRESSION: Negative.   Electronically Signed   By: Kerby Moors M.D.   On: 03/20/2014 22:21      MDM   Final diagnoses:  Injury    Symptoms consistent with contusion.  Mobic and muscle relaxants for pain.    I personally performed the  services described in this documentation, which was scribed in my presence. The recorded information has been reviewed and is accurate.    Emilio Baylock Alfonso Patten, MD 03/21/14 0530

## 2014-03-21 ENCOUNTER — Encounter (HOSPITAL_BASED_OUTPATIENT_CLINIC_OR_DEPARTMENT_OTHER): Payer: Self-pay | Admitting: Emergency Medicine

## 2014-08-10 ENCOUNTER — Emergency Department (HOSPITAL_BASED_OUTPATIENT_CLINIC_OR_DEPARTMENT_OTHER): Payer: BLUE CROSS/BLUE SHIELD

## 2014-08-10 ENCOUNTER — Encounter (HOSPITAL_BASED_OUTPATIENT_CLINIC_OR_DEPARTMENT_OTHER): Payer: Self-pay | Admitting: Emergency Medicine

## 2014-08-10 ENCOUNTER — Emergency Department (HOSPITAL_BASED_OUTPATIENT_CLINIC_OR_DEPARTMENT_OTHER)
Admission: EM | Admit: 2014-08-10 | Discharge: 2014-08-11 | Disposition: A | Discharge status: Home / Self Care | Payer: BLUE CROSS/BLUE SHIELD | Attending: Emergency Medicine | Admitting: Emergency Medicine

## 2014-08-10 DIAGNOSIS — Z720 Tobacco use: Secondary | ICD-10-CM | POA: Diagnosis not present

## 2014-08-10 DIAGNOSIS — Z79899 Other long term (current) drug therapy: Secondary | ICD-10-CM | POA: Diagnosis not present

## 2014-08-10 DIAGNOSIS — M79671 Pain in right foot: Secondary | ICD-10-CM | POA: Insufficient documentation

## 2014-08-10 DIAGNOSIS — E119 Type 2 diabetes mellitus without complications: Secondary | ICD-10-CM | POA: Insufficient documentation

## 2014-08-10 DIAGNOSIS — Z9119 Patient's noncompliance with other medical treatment and regimen: Secondary | ICD-10-CM | POA: Insufficient documentation

## 2014-08-10 DIAGNOSIS — Z791 Long term (current) use of non-steroidal anti-inflammatories (NSAID): Secondary | ICD-10-CM | POA: Diagnosis not present

## 2014-08-10 DIAGNOSIS — M7989 Other specified soft tissue disorders: Secondary | ICD-10-CM | POA: Diagnosis present

## 2014-08-10 LAB — CBG MONITORING, ED: Glucose-Capillary: 176 mg/dL — ABNORMAL HIGH (ref 70–99)

## 2014-08-10 MED ORDER — CLINDAMYCIN HCL 150 MG PO CAPS
300.0000 mg | ORAL_CAPSULE | Freq: Once | ORAL | Status: AC
  Administered 2014-08-10: 300 mg via ORAL
  Filled 2014-08-10: qty 2

## 2014-08-10 MED ORDER — CLINDAMYCIN HCL 150 MG PO CAPS: 300.0000 mg | ORAL_CAPSULE | Freq: Four times a day (QID) | ORAL | Status: DC

## 2014-08-10 MED ORDER — OXYCODONE-ACETAMINOPHEN 5-325 MG PO TABS: 1.0000 | ORAL_TABLET | Freq: Four times a day (QID) | ORAL | Status: DC | PRN

## 2014-08-10 MED ORDER — OXYCODONE-ACETAMINOPHEN 5-325 MG PO TABS
1.0000 | ORAL_TABLET | Freq: Once | ORAL | Status: AC
  Administered 2014-08-10: 1 via ORAL
  Filled 2014-08-10: qty 1

## 2014-08-10 NOTE — ED Provider Notes (Signed)
CSN: [id number]     Arrival date & time 08/10/14  2113 History  This chart was scribed for Tyler Beers, MD by Chester Holstein, ED Scribe. This patient was seen in room MH06/MH06 and the patient's care was started at 10:34 PM.    Chief Complaint  Patient presents with  . Foot Swelling     HPI HPI Comments: [name] is a 39 y.o. male with PMHx of DM who presents to the Emergency Department complaining of swelling to right foot with onset 2 days ago. Pt states he walked home bare footed for 5 miles 2 weeks ago. Pt states bilateral feet blistered, however left foot has healed. Pt notes associated redness to medial aspect of foot, which worsened and spread to his ankle yesterday, and blisters to ball and heel of foot. He notes severe pain with weight bearing. 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 Pt states CBG has ranged from 119-128 in past week. Pt denies break in skin, fever and numbness.   Past Medical History  Diagnosis Date  . Back pain   . Glomus tumor of middle ear   . Diabetes mellitus   . Non compliance w medication regimen    Past Surgical History  Procedure Laterality Date  . Brain tumor removal    . Inner ear surgery     Family History  Problem Relation Age of Onset  . Diabetes Mother   . Diabetes Father    History  Substance Use Topics  . Smoking status: Current Some Day Smoker -- 0.50 packs/day    Types: Cigarettes  . Smokeless tobacco: Not on file  . Alcohol Use: Yes     Comment: socially    Review of Systems  Constitutional: Negative for fever.  Musculoskeletal: Positive for myalgias.  Skin: Positive for color change (redness). Negative for wound.       blisters  Neurological: Negative for numbness.   ROS reviewed and all otherwise negative except for mentioned in HPI   Allergies  Review of patient's allergies indicates no known allergies.  Home Medications   Prior to Admission medications    Medication Sig Start Date End Date Taking? Authorizing Provider  acetaminophen (TYLENOL) 500 MG tablet Take 1,000 mg by mouth once as needed. For pain    Historical Provider, MD  B Complex-C (SUPER B COMPLEX) TABS Take 1 tablet by mouth daily.    Historical Provider, MD  clindamycin (CLEOCIN) 150 MG capsule Take 2 capsules (300 mg total) by mouth every 6 (six) hours. 08/10/14   Tyler Beers, MD  cyclobenzaprine (FLEXERIL) 10 MG tablet Take 1 tablet (10 mg total) by mouth 3 (three) times daily as needed for muscle spasms. 01/25/13   Calvert Cantor, MD  HYDROcodone-acetaminophen (NORCO/VICODIN) 5-325 MG per tablet Take 1 tablet by mouth every 6 (six) hours as needed for pain. 01/25/13   Calvert Cantor, MD  ibuprofen (ADVIL,MOTRIN) 200 MG tablet Take 600-800 mg by mouth every 8 (eight) hours as needed. For pain    Historical Provider, MD  meloxicam (MOBIC) 15 MG tablet Take 1 tablet (15 mg total) by mouth daily. 01/25/13   Calvert Cantor, MD  meloxicam (MOBIC) 7.5 MG tablet Take 1 tablet (7.5 mg total) by mouth daily. 03/20/14   April Palumbo, MD  metFORMIN (GLUCOPHAGE) 500 MG tablet Take 2 tablets (1,000 mg total) by mouth 2 (two) times daily with a meal. 01/25/13   Calvert Cantor, MD  methocarbamol (  ROBAXIN) 500 MG tablet Take 1 tablet (500 mg total) by mouth 2 (two) times daily. 03/20/14   April Palumbo, MD  oxyCODONE-acetaminophen (PERCOCET/ROXICET) 5-325 MG per tablet Take 1-2 tablets by mouth every 6 (six) hours as needed for severe pain. 08/10/14   Tyler Beers, MD   BP 156/99 mmHg  Pulse 76  Temp(Src) 98.7 F (37.1 C) (Oral)  Resp 18  Ht 5\' 8"  (1.727 m)  Wt 210 lb (95.255 kg)  BMI 31.94 kg/m2  SpO2 97% Physical Exam  Constitutional: He is oriented to person, place, and time. He appears well-developed and well-nourished.  HENT:  Head: Normocephalic.  Eyes: Conjunctivae are normal.  Neck: Normal range of motion. Neck supple.  Pulmonary/Chest: Effort normal.  Musculoskeletal: Normal  range of motion.  Neurological: He is alert and oriented to person, place, and time.  Skin: Skin is warm and dry.  Psychiatric: He has a normal mood and affect. His behavior is normal.  Nursing note and vitals reviewed. Physical Examination: General appearance - alert, well appearing, and in no distress Mental status - alert, oriented to person, place, and time Eyes - no conjunctival injection, no scleral icterus Chest - clear to auscultation, no wheezes, rales or rhonchi, symmetric air entry Heart - normal rate, regular rhythm, normal S1, S2, no murmurs, rubs, clicks or gallops Neurological - alert, oriented, normal speech, sensation intact in feet and toes bilaterally Musculoskeletal - , ttp over arch of right foot and heel, otherwise no joint tenderness, deformity or swelling Extremities - peripheral pulses normal, no pedal edema, no clubbing or cyanosis Skin - normal coloration and turgor, mild erythema overlying medial right midfoot at arch, healing blister to ball of left foot  ED Course  Procedures (including critical care time) DIAGNOSTIC STUDIES: Oxygen Saturation is 98% on room air, normal by my interpretation.    COORDINATION OF CARE: 10:44 PM Discussed treatment plan with patient at beside, the patient agrees with the plan and has no further questions at this time.   Labs Review Labs Reviewed  CBG MONITORING, ED - Abnormal; Notable for the following:    Glucose-Capillary 176 (*)    All other components within normal limits    Imaging Review Dg Foot Complete Right  08/10/2014   CLINICAL DATA:  Right foot swelling onset 2 days ago. Foot blistering.  EXAM: RIGHT FOOT COMPLETE - 3+ VIEW  COMPARISON:  None.  FINDINGS: There is no evidence of fracture or dislocation. No opaque foreign body.  IMPRESSION: Negative.   Electronically Signed   By: Monte Fantasia M.D.   On: 08/10/2014 23:27     EKG Interpretation None     MDM   Final diagnoses:  Foot pain, right  early  cellultiis of right foot   Pt presenting with pain in right foot after walking several miles in bare feet 2 weeks ago- left foot had blister that has healed, but right foot pain has been worsening.  Mild erythema over medial foot- will start on abx due to dm history and appearance of mild cellulitis.  Xray reassuring.   Xray images reviewed and interpreted by me as well.  Nursing notes including past medical history and social history reviewed and considered in documentation.  Discharged with strict return precautions.  Pt agreeable with plan.- pt advised to have area rechecked in the next 2 days.     I personally performed the services described in this documentation, which was scribed in my presence. The recorded information has been reviewed and  is accurate.     Tyler Beers, MD 08/12/14 [phone]

## 2014-08-10 NOTE — Discharge Instructions (Signed)
Return to the ED with any concerns including fever, increased area of redness, increased pain or swelling of foot, decreased level of alertness/lethargy, or any other alarming symptoms  You should have your foot rechecked in the next 2-3 days either by your primary care doctor or podiatry

## 2014-08-10 NOTE — ED Notes (Signed)
Pt states he walked 5 miles bare foot two weeks ago and now has swelling in right foot.

## 2014-08-12 ENCOUNTER — Encounter: Payer: Self-pay | Admitting: Podiatry

## 2014-08-12 ENCOUNTER — Ambulatory Visit (INDEPENDENT_AMBULATORY_CARE_PROVIDER_SITE_OTHER): Payer: BLUE CROSS/BLUE SHIELD | Admitting: Podiatry

## 2014-08-12 VITALS — BP 157/102 | HR 81 | Ht 70.0 in | Wt 210.0 lb

## 2014-08-12 DIAGNOSIS — M65971 Unspecified synovitis and tenosynovitis, right ankle and foot: Secondary | ICD-10-CM | POA: Insufficient documentation

## 2014-08-12 DIAGNOSIS — M79606 Pain in leg, unspecified: Secondary | ICD-10-CM | POA: Insufficient documentation

## 2014-08-12 DIAGNOSIS — M79604 Pain in right leg: Secondary | ICD-10-CM

## 2014-08-12 DIAGNOSIS — M659 Synovitis and tenosynovitis, unspecified: Secondary | ICD-10-CM | POA: Insufficient documentation

## 2014-08-12 DIAGNOSIS — M79673 Pain in unspecified foot: Secondary | ICD-10-CM | POA: Diagnosis not present

## 2014-08-12 MED ORDER — OXYCODONE-ACETAMINOPHEN 5-325 MG PO TABS: 1.0000 | ORAL_TABLET | Freq: Four times a day (QID) | ORAL | Status: DC | PRN

## 2014-08-12 MED ORDER — NABUMETONE 500 MG PO TABS: 500.0000 mg | ORAL_TABLET | Freq: Two times a day (BID) | ORAL | Status: DC

## 2014-08-12 NOTE — Patient Instructions (Signed)
Seen for pain in right foot. Stay off from work till Friday.

## 2014-08-12 NOTE — Progress Notes (Signed)
Subjective: 39 year old male presents complaining of pain in right foot.  He walked on concrete in barefoot 5 miles 2 weeks ago. Got blisters on both feet. Right heel area had swelling and fever. Been to ER and got antibiotics and pain pills. Swelling has decreased and pain is little less. Wearing Flat surgical shoe and having more problem with it. Also on Clindamycin.   Review of Systems - Negative except Right ear problem on going and hypertension.   Objective: Dermatologic: Old dry blistered skin on ball of both feet. Swollen right heel with bruise like mark at instep area. No open lesions no increase in temperature noted.  Neurovascular status are within normal. Orthopedic: Cavus type high arched foot.  Assessment: Soft tissue injury right heel from strenuous long distance walk in concrete.   Plan: Reviewed clinical findings and available options. May wear regular shoes as tolerated. Take Relafen q 12 hours and pain pill as needed. Finish antibiotics.  Stay off of feet another week before returning to work.

## 2014-12-09 ENCOUNTER — Ambulatory Visit (INDEPENDENT_AMBULATORY_CARE_PROVIDER_SITE_OTHER): Payer: BLUE CROSS/BLUE SHIELD | Admitting: Emergency Medicine

## 2014-12-09 VITALS — BP 158/98 | HR 87 | Temp 98.6°F | Resp 16 | Ht 67.0 in | Wt 199.0 lb

## 2014-12-09 DIAGNOSIS — H66004 Acute suppurative otitis media without spontaneous rupture of ear drum, recurrent, right ear: Secondary | ICD-10-CM

## 2014-12-09 DIAGNOSIS — I10 Essential (primary) hypertension: Secondary | ICD-10-CM | POA: Insufficient documentation

## 2014-12-09 DIAGNOSIS — E119 Type 2 diabetes mellitus without complications: Secondary | ICD-10-CM | POA: Diagnosis not present

## 2014-12-09 LAB — POCT UA - MICROSCOPIC ONLY
BACTERIA, U MICROSCOPIC: NEGATIVE
CASTS, UR, LPF, POC: NEGATIVE
CRYSTALS, UR, HPF, POC: NEGATIVE
Mucus, UA: NEGATIVE
Yeast, UA: NEGATIVE

## 2014-12-09 LAB — CBC
HEMATOCRIT: 51.3 % (ref 39.0–52.0)
HEMOGLOBIN: 17.9 g/dL — AB (ref 13.0–17.0)
MCH: 32.1 pg (ref 26.0–34.0)
MCHC: 34.9 g/dL (ref 30.0–36.0)
MCV: 92.1 fL (ref 78.0–100.0)
MPV: 10.9 fL (ref 8.6–12.4)
Platelets: 278 10*3/uL (ref 150–400)
RBC: 5.57 MIL/uL (ref 4.22–5.81)
RDW: 13.3 % (ref 11.5–15.5)
WBC: 9.8 10*3/uL (ref 4.0–10.5)

## 2014-12-09 LAB — COMPREHENSIVE METABOLIC PANEL
ALBUMIN: 4.3 g/dL (ref 3.6–5.1)
ALT: 27 U/L (ref 9–46)
AST: 16 U/L (ref 10–40)
Alkaline Phosphatase: 70 U/L (ref 40–115)
BUN: 8 mg/dL (ref 7–25)
CALCIUM: 9.7 mg/dL (ref 8.6–10.3)
CHLORIDE: 98 mmol/L (ref 98–110)
CO2: 26 mmol/L (ref 20–31)
CREATININE: 0.74 mg/dL (ref 0.60–1.35)
Glucose, Bld: 313 mg/dL — ABNORMAL HIGH (ref 65–99)
POTASSIUM: 4.6 mmol/L (ref 3.5–5.3)
SODIUM: 137 mmol/L (ref 135–146)
TOTAL PROTEIN: 7.3 g/dL (ref 6.1–8.1)
Total Bilirubin: 1.1 mg/dL (ref 0.2–1.2)

## 2014-12-09 LAB — POCT URINALYSIS DIPSTICK
BILIRUBIN UA: NEGATIVE
GLUCOSE UA: 500
LEUKOCYTES UA: NEGATIVE
NITRITE UA: NEGATIVE
PH UA: 6
Protein, UA: 30
RBC UA: NEGATIVE
Spec Grav, UA: 1.01
Urobilinogen, UA: 0.2

## 2014-12-09 LAB — LIPID PANEL
CHOLESTEROL: 147 mg/dL (ref 125–200)
HDL: 29 mg/dL — AB (ref >=40)
LDL CALC: 54 mg/dL (ref <130)
TRIGLYCERIDES: 319 mg/dL — AB (ref <150)
Total CHOL/HDL Ratio: 5.1 Ratio — ABNORMAL HIGH (ref <=5.0)
VLDL: 64 mg/dL — AB (ref <30)

## 2014-12-09 LAB — GLUCOSE, POCT (MANUAL RESULT ENTRY): POC GLUCOSE: 328 mg/dL — AB (ref 70–99)

## 2014-12-09 LAB — POCT GLYCOSYLATED HEMOGLOBIN (HGB A1C): HEMOGLOBIN A1C: 10.2

## 2014-12-09 MED ORDER — LIRAGLUTIDE 18 MG/3ML ~~LOC~~ SOPN: PEN_INJECTOR | SUBCUTANEOUS | Status: DC

## 2014-12-09 MED ORDER — METFORMIN HCL ER 500 MG PO TB24: ORAL_TABLET | ORAL | Status: DC

## 2014-12-09 MED ORDER — LISINOPRIL 10 MG PO TABS: 10.0000 mg | ORAL_TABLET | Freq: Every day | ORAL | Status: DC

## 2014-12-09 MED ORDER — OFLOXACIN 0.3 % OT SOLN: 10.0000 [drp] | Freq: Two times a day (BID) | OTIC | Status: DC

## 2014-12-09 MED ORDER — AMOXICILLIN-POT CLAVULANATE 875-125 MG PO TABS: 1.0000 | ORAL_TABLET | Freq: Two times a day (BID) | ORAL | Status: DC

## 2014-12-09 MED ORDER — INSULIN PEN NEEDLE 32G X 6 MM MISC: Status: DC

## 2014-12-09 NOTE — Patient Instructions (Signed)

## 2014-12-09 NOTE — Progress Notes (Signed)
Subjective:  Patient ID: Tyler Green, male    DOB: [birth year]  Age: 39 y.o. MRN: [id number]  CC: Ear Pain; Hypertension; and Diabetes   HPI Tyler Green presents  with drainage from his right ear. He says he has pain decreased. Drainage has been present for quite a long time. Has a history of some surgical procedure on that ear in the past. He has no fever or chills.  He is known to have hypertension and diabetes and has been untreated for quite a long time to ensure used to be in the 500s and then came down the 200s now is less than 100. The last time he checked it checked anyway. He says he has pain and numbness in his feet.  History Tyler Green has a past medical history of Back pain; Glomus tumor of middle ear; Diabetes mellitus; and Non compliance w medication regimen.   He has past surgical history that includes brain tumor removal and Inner ear surgery.   His  family history includes Diabetes in his father and mother.  He   reports that he has been smoking Cigarettes.  He has been smoking about 0.50 packs per day. He has never used smokeless tobacco. He reports that he drinks alcohol. He reports that he does not use illicit drugs.  Outpatient Prescriptions Prior to Visit  Medication Sig Dispense Refill  . ibuprofen (ADVIL,MOTRIN) 200 MG tablet Take 600-800 mg by mouth every 8 (eight) hours as needed. For pain    . acetaminophen (TYLENOL) 500 MG tablet Take 1,000 mg by mouth once as needed. For pain    . B Complex-C (SUPER B COMPLEX) TABS Take 1 tablet by mouth daily.    . clindamycin (CLEOCIN) 150 MG capsule Take 2 capsules (300 mg total) by mouth every 6 (six) hours. (Patient not taking: Reported on 12/09/2014) 28 capsule 0  . cyclobenzaprine (FLEXERIL) 10 MG tablet Take 1 tablet (10 mg total) by mouth 3 (three) times daily as needed for muscle spasms. (Patient not taking: Reported on 12/09/2014) 30 tablet 0  . HYDROcodone-acetaminophen (NORCO/VICODIN) 5-325 MG per tablet  Take 1 tablet by mouth every 6 (six) hours as needed for pain. (Patient not taking: Reported on 12/09/2014) 15 tablet 0  . meloxicam (MOBIC) 15 MG tablet Take 1 tablet (15 mg total) by mouth daily. (Patient not taking: Reported on 12/09/2014) 30 tablet 1  . meloxicam (MOBIC) 7.5 MG tablet Take 1 tablet (7.5 mg total) by mouth daily. (Patient not taking: Reported on 12/09/2014) 7 tablet 0  . metFORMIN (GLUCOPHAGE) 500 MG tablet Take 2 tablets (1,000 mg total) by mouth 2 (two) times daily with a meal. (Patient not taking: Reported on 12/09/2014) 120 tablet 1  . methocarbamol (ROBAXIN) 500 MG tablet Take 1 tablet (500 mg total) by mouth 2 (two) times daily. (Patient not taking: Reported on 12/09/2014) 20 tablet 0  . nabumetone (RELAFEN) 500 MG tablet Take 1 tablet (500 mg total) by mouth 2 (two) times daily. (Patient not taking: Reported on 12/09/2014) 60 tablet 1  . oxyCODONE-acetaminophen (PERCOCET/ROXICET) 5-325 MG per tablet Take 1-2 tablets by mouth every 6 (six) hours as needed for severe pain. (Patient not taking: Reported on 12/09/2014) 60 tablet 0   No facility-administered medications prior to visit.    Social History   Social History  . Marital Status: Single    Spouse Name: N/A  . Number of Children: N/A  . Years of Education: N/A   Social History Main Topics  .  Smoking status: Current Some Day Smoker -- 0.50 packs/day    Types: Cigarettes  . Smokeless tobacco: Never Used  . Alcohol Use: 0.0 oz/week    0 Standard drinks or equivalent per week     Comment: socially  . Drug Use: No  . Sexual Activity: Not Asked   Other Topics Concern  . None   Social History Narrative     Review of Systems  Objective:  BP 158/98 mmHg  Pulse 87  Temp(Src) 98.6 F (37 C) (Oral)  Resp 16  Ht 5\' 7"  (1.702 m)  Wt 199 lb (90.266 kg)  BMI 31.16 kg/m2  SpO2 98%  Physical Exam    Assessment & Plan:   Tyler Green was seen today for ear pain, hypertension and diabetes.  Diagnoses and all  orders for this visit:  Essential hypertension, benign -     CBC -     Comprehensive metabolic panel -     Lipid panel -     POCT urinalysis dipstick -     POCT UA - Microscopic Only -     Microalbumin, urine -     Cancel: POCT glycosylated hemoglobin (Hb A1C) -     Cancel: POCT glucose (manual entry) -     Ambulatory referral to ENT -     POCT glucose (manual entry) -     POCT glycosylated hemoglobin (Hb A1C)  Diabetes mellitus without complication -     CBC -     Comprehensive metabolic panel -     Lipid panel -     POCT urinalysis dipstick -     POCT UA - Microscopic Only -     Microalbumin, urine -     Cancel: POCT glycosylated hemoglobin (Hb A1C) -     Cancel: POCT glucose (manual entry) -     Ambulatory referral to ENT -     POCT glucose (manual entry) -     POCT glycosylated hemoglobin (Hb A1C)  Recurrent suppurative otitis media of right ear without spontaneous rupture of tympanic membrane, unspecified chronicity -     Ambulatory referral to ENT -     POCT glucose (manual entry) -     POCT glycosylated hemoglobin (Hb A1C)  Other orders -     Discontinue: ofloxacin (FLOXIN) 0.3 % otic solution; Place 10 drops into the right ear 2 (two) times daily. -     metFORMIN (GLUCOPHAGE XR) 500 MG 24 hr tablet; 1 tablet with evening meal for 1 week.  Then 2 for a week, then 3 for a week, then 4 daily -     Discontinue: Liraglutide (VICTOZA) 18 MG/3ML SOPN; 0.6 for one week, 1.2 for a week, 1.8 after -     lisinopril (PRINIVIL,ZESTRIL) 10 MG tablet; Take 1 tablet (10 mg total) by mouth daily. -     Discontinue: Insulin Pen Needle (FIFTY50 PEN NEEDLES) 32G X 6 MM MISC; 1 daily -     amoxicillin-clavulanate (AUGMENTIN) 875-125 MG per tablet; Take 1 tablet by mouth 2 (two) times daily.   I have discontinued Mr. Tyler Green ofloxacin, Liraglutide, and Insulin Pen Needle. I am also having him start on metFORMIN, lisinopril, and amoxicillin-clavulanate. Additionally, I am having him  maintain his SUPER B COMPLEX, ibuprofen, acetaminophen, metFORMIN, cyclobenzaprine, HYDROcodone-acetaminophen, meloxicam, meloxicam, methocarbamol, clindamycin, oxyCODONE-acetaminophen, and nabumetone.  Meds ordered this encounter  Medications  . DISCONTD: ofloxacin (FLOXIN) 0.3 % otic solution    Sig: Place 10 drops into the right ear 2 (two)  times daily.    Dispense:  5 mL    Refill:  0  . metFORMIN (GLUCOPHAGE XR) 500 MG 24 hr tablet    Sig: 1 tablet with evening meal for 1 week.  Then 2 for a week, then 3 for a week, then 4 daily    Dispense:  120 tablet    Refill:  1  . DISCONTD: Liraglutide (VICTOZA) 18 MG/3ML SOPN    Sig: 0.6 for one week, 1.2 for a week, 1.8 after    Dispense:  3 pen    Refill:  2  . lisinopril (PRINIVIL,ZESTRIL) 10 MG tablet    Sig: Take 1 tablet (10 mg total) by mouth daily.    Dispense:  90 tablet    Refill:  3  . DISCONTD: Insulin Pen Needle (FIFTY50 PEN NEEDLES) 32G X 6 MM MISC    Sig: 1 daily    Dispense:  50 each    Refill:  5  . amoxicillin-clavulanate (AUGMENTIN) 875-125 MG per tablet    Sig: Take 1 tablet by mouth 2 (two) times daily.    Dispense:  20 tablet    Refill:  0    Appropriate red flag conditions were discussed with the patient as well as actions that should be taken.  Patient expressed his understanding.  Follow-up: No Follow-up on file.  Roselee Culver, MD   Results for orders placed or performed in visit on 12/09/14  POCT urinalysis dipstick  Result Value Ref Range   Color, UA yellow    Clarity, UA clear    Glucose, UA 500    Bilirubin, UA neg    Ketones, UA >=160    Spec Grav, UA 1.010    Blood, UA neg    pH, UA 6.0    Protein, UA 30    Urobilinogen, UA 0.2    Nitrite, UA neg    Leukocytes, UA Negative Negative  POCT UA - Microscopic Only  Result Value Ref Range   WBC, Ur, HPF, POC 0-1    RBC, urine, microscopic 0-1    Bacteria, U Microscopic neg    Mucus, UA neg    Epithelial cells, urine per micros 0-1      Crystals, Ur, HPF, POC neg    Casts, Ur, LPF, POC neg    Yeast, UA neg   POCT glucose (manual entry)  Result Value Ref Range   POC Glucose 328 (A) 70 - 99 mg/dl  POCT glycosylated hemoglobin (Hb A1C)  Result Value Ref Range   Hemoglobin A1C 10.2

## 2014-12-10 ENCOUNTER — Other Ambulatory Visit: Payer: Self-pay | Admitting: Emergency Medicine

## 2014-12-10 LAB — MICROALBUMIN, URINE: Microalb, Ur: 7 mg/dL — ABNORMAL HIGH (ref <2.0)

## 2014-12-10 MED ORDER — ATORVASTATIN CALCIUM 40 MG PO TABS: 40.0000 mg | ORAL_TABLET | Freq: Every day | ORAL | Status: DC

## 2016-01-14 ENCOUNTER — Emergency Department (HOSPITAL_COMMUNITY): Payer: BLUE CROSS/BLUE SHIELD

## 2016-01-14 ENCOUNTER — Inpatient Hospital Stay (HOSPITAL_COMMUNITY)
Admission: EM | Admit: 2016-01-14 | Discharge: 2016-01-16 | DRG: 917 | Disposition: A | Discharge status: Home / Self Care | Payer: BLUE CROSS/BLUE SHIELD | Attending: Internal Medicine | Admitting: Internal Medicine

## 2016-01-14 ENCOUNTER — Encounter (HOSPITAL_COMMUNITY): Payer: Self-pay | Admitting: Emergency Medicine

## 2016-01-14 DIAGNOSIS — R401 Stupor: Secondary | ICD-10-CM | POA: Diagnosis present

## 2016-01-14 DIAGNOSIS — R4189 Other symptoms and signs involving cognitive functions and awareness: Secondary | ICD-10-CM

## 2016-01-14 DIAGNOSIS — R410 Disorientation, unspecified: Secondary | ICD-10-CM

## 2016-01-14 DIAGNOSIS — I10 Essential (primary) hypertension: Secondary | ICD-10-CM | POA: Diagnosis present

## 2016-01-14 DIAGNOSIS — T450X1A Poisoning by antiallergic and antiemetic drugs, accidental (unintentional), initial encounter: Secondary | ICD-10-CM | POA: Diagnosis present

## 2016-01-14 DIAGNOSIS — R402431 Glasgow coma scale score 3-8, in the field [EMT or ambulance]: Secondary | ICD-10-CM | POA: Diagnosis not present

## 2016-01-14 DIAGNOSIS — Y902 Blood alcohol level of 40-59 mg/100 ml: Secondary | ICD-10-CM | POA: Diagnosis present

## 2016-01-14 DIAGNOSIS — G9340 Encephalopathy, unspecified: Secondary | ICD-10-CM | POA: Diagnosis not present

## 2016-01-14 DIAGNOSIS — J9601 Acute respiratory failure with hypoxia: Secondary | ICD-10-CM | POA: Diagnosis present

## 2016-01-14 DIAGNOSIS — F10129 Alcohol abuse with intoxication, unspecified: Secondary | ICD-10-CM | POA: Diagnosis present

## 2016-01-14 DIAGNOSIS — R404 Transient alteration of awareness: Secondary | ICD-10-CM | POA: Diagnosis not present

## 2016-01-14 DIAGNOSIS — Z833 Family history of diabetes mellitus: Secondary | ICD-10-CM | POA: Diagnosis not present

## 2016-01-14 DIAGNOSIS — E872 Acidosis, unspecified: Secondary | ICD-10-CM

## 2016-01-14 DIAGNOSIS — F17210 Nicotine dependence, cigarettes, uncomplicated: Secondary | ICD-10-CM | POA: Diagnosis present

## 2016-01-14 DIAGNOSIS — G92 Toxic encephalopathy: Secondary | ICD-10-CM | POA: Diagnosis present

## 2016-01-14 DIAGNOSIS — R55 Syncope and collapse: Secondary | ICD-10-CM | POA: Diagnosis not present

## 2016-01-14 DIAGNOSIS — F329 Major depressive disorder, single episode, unspecified: Secondary | ICD-10-CM | POA: Diagnosis present

## 2016-01-14 DIAGNOSIS — F1410 Cocaine abuse, uncomplicated: Secondary | ICD-10-CM | POA: Diagnosis present

## 2016-01-14 DIAGNOSIS — T405X1A Poisoning by cocaine, accidental (unintentional), initial encounter: Secondary | ICD-10-CM | POA: Diagnosis present

## 2016-01-14 DIAGNOSIS — F1910 Other psychoactive substance abuse, uncomplicated: Secondary | ICD-10-CM

## 2016-01-14 DIAGNOSIS — E1165 Type 2 diabetes mellitus with hyperglycemia: Secondary | ICD-10-CM | POA: Diagnosis present

## 2016-01-14 DIAGNOSIS — R739 Hyperglycemia, unspecified: Secondary | ICD-10-CM

## 2016-01-14 LAB — BLOOD GAS, ARTERIAL
Acid-base deficit: 0.6 mmol/L (ref 0.0–2.0)
BICARBONATE: 22.6 mmol/L (ref 20.0–28.0)
DRAWN BY: 331471
O2 Content: 2 L/min
O2 Saturation: 97.8 %
PH ART: 7.428 (ref 7.350–7.450)
PO2 ART: 102 mmHg (ref 83.0–108.0)
Patient temperature: 98.6
pCO2 arterial: 34.9 mmHg (ref 32.0–48.0)

## 2016-01-14 LAB — BASIC METABOLIC PANEL
ANION GAP: 6 (ref 5–15)
BUN: 10 mg/dL (ref 6–20)
CHLORIDE: 112 mmol/L — AB (ref 101–111)
CO2: 26 mmol/L (ref 22–32)
Calcium: 8.3 mg/dL — ABNORMAL LOW (ref 8.9–10.3)
Creatinine, Ser: 1.23 mg/dL (ref 0.61–1.24)
GFR calc Af Amer: >60 mL/min (ref >60)
GFR calc non Af Amer: >60 mL/min (ref >60)
Glucose, Bld: 272 mg/dL — ABNORMAL HIGH (ref 65–99)
POTASSIUM: 3.8 mmol/L (ref 3.5–5.1)
SODIUM: 144 mmol/L (ref 135–145)

## 2016-01-14 LAB — RAPID URINE DRUG SCREEN, HOSP PERFORMED
Amphetamines: NOT DETECTED
BARBITURATES: NOT DETECTED
Benzodiazepines: NOT DETECTED
COCAINE: POSITIVE — AB
Opiates: NOT DETECTED
TETRAHYDROCANNABINOL: NOT DETECTED

## 2016-01-14 LAB — COMPREHENSIVE METABOLIC PANEL WITH GFR
ALT: 18 U/L (ref 17–63)
AST: 12 U/L — ABNORMAL LOW (ref 15–41)
Albumin: 4.3 g/dL (ref 3.5–5.0)
Alkaline Phosphatase: 57 U/L (ref 38–126)
Anion gap: 15 (ref 5–15)
BUN: 13 mg/dL (ref 6–20)
CO2: 20 mmol/L — ABNORMAL LOW (ref 22–32)
Calcium: 8.5 mg/dL — ABNORMAL LOW (ref 8.9–10.3)
Chloride: 103 mmol/L (ref 101–111)
Creatinine, Ser: 0.86 mg/dL (ref 0.61–1.24)
GFR calc Af Amer: >60 mL/min
GFR calc non Af Amer: >60 mL/min
Glucose, Bld: 437 mg/dL — ABNORMAL HIGH (ref 65–99)
Potassium: 3.9 mmol/L (ref 3.5–5.1)
Sodium: 138 mmol/L (ref 135–145)
Total Bilirubin: 0.2 mg/dL — ABNORMAL LOW (ref 0.3–1.2)
Total Protein: 6.7 g/dL (ref 6.5–8.1)

## 2016-01-14 LAB — BLOOD GAS, VENOUS
Acid-base deficit: 7.3 mmol/L — ABNORMAL HIGH (ref 0.0–2.0)
Bicarbonate: 18.9 mmol/L — ABNORMAL LOW (ref 20.0–28.0)
O2 Saturation: 77.5 %
PATIENT TEMPERATURE: 98.6
PH VEN: 7.275 (ref 7.250–7.430)
pCO2, Ven: 42 mmHg — ABNORMAL LOW (ref 44.0–60.0)
pO2, Ven: 48 mmHg — ABNORMAL HIGH (ref 32.0–45.0)

## 2016-01-14 LAB — URINALYSIS, ROUTINE W REFLEX MICROSCOPIC
BILIRUBIN URINE: NEGATIVE
HGB URINE DIPSTICK: NEGATIVE
KETONES UR: 15 mg/dL — AB
Leukocytes, UA: NEGATIVE
NITRITE: NEGATIVE
Protein, ur: NEGATIVE mg/dL
Specific Gravity, Urine: 1.02 (ref 1.005–1.030)
pH: 5.5 (ref 5.0–8.0)

## 2016-01-14 LAB — CBG MONITORING, ED
GLUCOSE-CAPILLARY: 217 mg/dL — AB (ref 65–99)
Glucose-Capillary: 176 mg/dL — ABNORMAL HIGH (ref 65–99)
Glucose-Capillary: 420 mg/dL — ABNORMAL HIGH (ref 65–99)
Glucose-Capillary: 289 mg/dL — ABNORMAL HIGH (ref 65–99)

## 2016-01-14 LAB — CBC WITH DIFFERENTIAL/PLATELET
Basophils Absolute: 0.1 10*3/uL (ref 0.0–0.1)
Basophils Relative: 1 %
EOS ABS: 0 10*3/uL (ref 0.0–0.7)
EOS PCT: 0 %
HCT: 43.5 % (ref 39.0–52.0)
Hemoglobin: 15.7 g/dL (ref 13.0–17.0)
LYMPHS ABS: 2.7 10*3/uL (ref 0.7–4.0)
Lymphocytes Relative: 29 %
MCH: 32.9 pg (ref 26.0–34.0)
MCHC: 36.1 g/dL — ABNORMAL HIGH (ref 30.0–36.0)
MCV: 91.2 fL (ref 78.0–100.0)
MONO ABS: 0.9 10*3/uL (ref 0.1–1.0)
Monocytes Relative: 9 %
Neutro Abs: 5.6 10*3/uL (ref 1.7–7.7)
Neutrophils Relative %: 61 %
PLATELETS: 255 10*3/uL (ref 150–400)
RBC: 4.77 MIL/uL (ref 4.22–5.81)
RDW: 12.1 % (ref 11.5–15.5)
WBC: 9.2 10*3/uL (ref 4.0–10.5)

## 2016-01-14 LAB — URINE MICROSCOPIC-ADD ON: WBC, UA: NONE SEEN WBC/hpf (ref 0–5)

## 2016-01-14 LAB — CK
CK TOTAL: 56 U/L (ref 49–397)
CK TOTAL: 107 U/L (ref 49–397)

## 2016-01-14 LAB — TROPONIN I: Troponin I: <0.03 ng/mL (ref <0.03)

## 2016-01-14 LAB — I-STAT CG4 LACTIC ACID, ED
LACTIC ACID, VENOUS: 4.49 mmol/L — AB (ref 0.5–1.9)
Lactic Acid, Venous: 1.85 mmol/L (ref 0.5–1.9)

## 2016-01-14 LAB — ETHANOL: Alcohol, Ethyl (B): 49 mg/dL — ABNORMAL HIGH

## 2016-01-14 MED ORDER — SODIUM CHLORIDE 0.9 % IV BOLUS (SEPSIS)
1000.0000 mL | Freq: Once | INTRAVENOUS | Status: AC
  Administered 2016-01-14: 1000 mL via INTRAVENOUS

## 2016-01-14 MED ORDER — ENOXAPARIN SODIUM 40 MG/0.4ML ~~LOC~~ SOLN
40.0000 mg | SUBCUTANEOUS | Status: DC
  Administered 2016-01-15: 40 mg via SUBCUTANEOUS
  Filled 2016-01-14 (×2): qty 0.4

## 2016-01-14 MED ORDER — SODIUM CHLORIDE 0.9 % IV SOLN
INTRAVENOUS | Status: DC
  Administered 2016-01-14 (×2): via INTRAVENOUS

## 2016-01-14 MED ORDER — HALOPERIDOL LACTATE 5 MG/ML IJ SOLN
5.0000 mg | Freq: Four times a day (QID) | INTRAMUSCULAR | Status: AC | PRN
  Administered 2016-01-14: 5 mg via INTRAMUSCULAR
  Filled 2016-01-14: qty 1

## 2016-01-14 MED ORDER — ACETAMINOPHEN 650 MG RE SUPP: 650.0000 mg | Freq: Four times a day (QID) | RECTAL | Status: DC | PRN

## 2016-01-14 MED ORDER — ONDANSETRON HCL 4 MG/2ML IJ SOLN: 4.0000 mg | Freq: Four times a day (QID) | INTRAMUSCULAR | Status: DC | PRN

## 2016-01-14 MED ORDER — THIAMINE HCL 100 MG/ML IJ SOLN
100.0000 mg | Freq: Every day | INTRAMUSCULAR | Status: DC
  Administered 2016-01-15 – 2016-01-16 (×2): 100 mg via INTRAVENOUS
  Filled 2016-01-14 (×2): qty 2

## 2016-01-14 MED ORDER — INSULIN ASPART 100 UNIT/ML ~~LOC~~ SOLN
1.0000 [IU] | SUBCUTANEOUS | Status: DC
  Administered 2016-01-14 – 2016-01-15 (×4): 3 [IU] via SUBCUTANEOUS
  Administered 2016-01-15 – 2016-01-16 (×2): 2 [IU] via SUBCUTANEOUS
  Administered 2016-01-16 (×2): 3 [IU] via SUBCUTANEOUS
  Filled 2016-01-14 (×3): qty 1

## 2016-01-14 MED ORDER — LORAZEPAM 2 MG/ML IJ SOLN: 2.0000 mg | INTRAMUSCULAR | Status: DC | PRN

## 2016-01-14 MED ORDER — LORAZEPAM 2 MG/ML IJ SOLN
0.0000 mg | Freq: Four times a day (QID) | INTRAMUSCULAR | Status: DC
  Administered 2016-01-14: 2 mg via INTRAVENOUS
  Administered 2016-01-14: 1 mg via INTRAVENOUS
  Filled 2016-01-14 (×2): qty 1

## 2016-01-14 MED ORDER — SODIUM CHLORIDE 0.9 % IV SOLN: INTRAVENOUS | Status: DC

## 2016-01-14 MED ORDER — THIAMINE HCL 100 MG/ML IJ SOLN
Freq: Once | INTRAVENOUS | Status: AC
  Administered 2016-01-14: 21:00:00 via INTRAVENOUS
  Filled 2016-01-14: qty 1000

## 2016-01-14 MED ORDER — THIAMINE HCL 100 MG/ML IJ SOLN
100.0000 mg | Freq: Every day | INTRAMUSCULAR | Status: DC
  Administered 2016-01-14: 100 mg via INTRAVENOUS
  Filled 2016-01-14: qty 2

## 2016-01-14 MED ORDER — VITAMIN B-1 100 MG PO TABS: 100.0000 mg | ORAL_TABLET | Freq: Every day | ORAL | Status: DC

## 2016-01-14 MED ORDER — ONDANSETRON HCL 4 MG PO TABS: 4.0000 mg | ORAL_TABLET | Freq: Four times a day (QID) | ORAL | Status: DC | PRN

## 2016-01-14 MED ORDER — SODIUM CHLORIDE 0.9% FLUSH
3.0000 mL | Freq: Two times a day (BID) | INTRAVENOUS | Status: DC
  Administered 2016-01-15 – 2016-01-16 (×4): 3 mL via INTRAVENOUS

## 2016-01-14 MED ORDER — LORAZEPAM 2 MG/ML IJ SOLN: 0.0000 mg | Freq: Two times a day (BID) | INTRAMUSCULAR | Status: DC

## 2016-01-14 MED ORDER — ACETAMINOPHEN 325 MG PO TABS: 650.0000 mg | ORAL_TABLET | Freq: Four times a day (QID) | ORAL | Status: DC | PRN

## 2016-01-14 NOTE — ED Notes (Signed)
X-ray at bedside

## 2016-01-14 NOTE — ED Notes (Signed)
CBG 405 

## 2016-01-14 NOTE — ED Notes (Signed)
EDP PFIEFER made family aware of admission

## 2016-01-14 NOTE — ED Notes (Signed)
Patient transported to CT 

## 2016-01-14 NOTE — ED Notes (Signed)
MD at bedside.UPDATED ON PT CURRENT STATUS

## 2016-01-14 NOTE — ED Provider Notes (Signed)
[facility] DEPT Provider Note   CSN: [id number] Arrival date & time: 01/14/16  0745     History   Chief Complaint Chief Complaint  Patient presents with  . Alcohol Intoxication  . Hyperglycemia    HPI NIKKOLAI HARTL is a 40 y.o. male.  HPI Patient presents via EMS. Patient is altered, responding only minimally to pain, level V caveat secondary to change in mental status. EMS reports that the patient was found at home, by a male. She told them that the patient has a history of depression, diabetes. She reports that the patient went out last night, drink alcohol, used cocaine. She found him this morning hypersomnolence, but with no evidence for,, fall. EMS reports that in route to the patient was intermittently apneic, was essentially unresponsive for the entirety of transport. Point-of-care glucose was greater than 500.  Past Medical History:  Diagnosis Date  . Back pain   . Diabetes mellitus   . 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 Non compliance w medication regimen     Patient Active Problem List   Diagnosis Date Noted  . Essential hypertension, benign 12/09/2014  . Tenosynovitis of right foot 08/12/2014  . Pain in lower limb 08/12/2014    Past Surgical History:  Procedure Laterality Date  . brain tumor removal    . INNER EAR SURGERY         Home Medications    Prior to Admission medications   Medication Sig Start Date End Date Taking? Authorizing Provider  acetaminophen (TYLENOL) 500 MG tablet Take 1,000 mg by mouth once as needed. For pain    Historical Provider, MD  amoxicillin-clavulanate (AUGMENTIN) 875-125 MG per tablet Take 1 tablet by mouth 2 (two) times daily. 12/09/14   Roselee Culver, MD  atorvastatin (LIPITOR) 40 MG tablet Take 1 tablet (40 mg total) by mouth daily. 12/10/14   Roselee Culver, MD  B Complex-C (SUPER B COMPLEX) TABS Take 1 tablet by mouth daily.    Historical Provider, MD  clindamycin (CLEOCIN) 150 MG capsule Take  2 capsules (300 mg total) by mouth every 6 (six) hours. Patient not taking: Reported on 12/09/2014 08/10/14   Alfonzo Beers, MD  cyclobenzaprine (FLEXERIL) 10 MG tablet Take 1 tablet (10 mg total) by mouth 3 (three) times daily as needed for muscle spasms. Patient not taking: Reported on 12/09/2014 01/25/13   Calvert Cantor, MD  HYDROcodone-acetaminophen (NORCO/VICODIN) 5-325 MG per tablet Take 1 tablet by mouth every 6 (six) hours as needed for pain. Patient not taking: Reported on 12/09/2014 01/25/13   Calvert Cantor, MD  ibuprofen (ADVIL,MOTRIN) 200 MG tablet Take 600-800 mg by mouth every 8 (eight) hours as needed. For pain    Historical Provider, MD  lisinopril (PRINIVIL,ZESTRIL) 10 MG tablet Take 1 tablet (10 mg total) by mouth daily. 12/09/14   Roselee Culver, MD  meloxicam (MOBIC) 15 MG tablet Take 1 tablet (15 mg total) by mouth daily. Patient not taking: Reported on 12/09/2014 01/25/13   Calvert Cantor, MD  meloxicam (MOBIC) 7.5 MG tablet Take 1 tablet (7.5 mg total) by mouth daily. Patient not taking: Reported on 12/09/2014 03/20/14   April Palumbo, MD  metFORMIN (GLUCOPHAGE XR) 500 MG 24 hr tablet 1 tablet with evening meal for 1 week.  Then 2 for a week, then 3 for a week, then 4 daily 12/09/14   Roselee Culver, MD  metFORMIN (GLUCOPHAGE) 500 MG tablet Take 2 tablets (1,000 mg total) by mouth 2 (  two) times daily with a meal. Patient not taking: Reported on 12/09/2014 01/25/13   Calvert Cantor, MD  methocarbamol (ROBAXIN) 500 MG tablet Take 1 tablet (500 mg total) by mouth 2 (two) times daily. Patient not taking: Reported on 12/09/2014 03/20/14   April Palumbo, MD  nabumetone (RELAFEN) 500 MG tablet Take 1 tablet (500 mg total) by mouth 2 (two) times daily. Patient not taking: Reported on 12/09/2014 08/12/14   Myeong O Sheard, DPM  oxyCODONE-acetaminophen (PERCOCET/ROXICET) 5-325 MG per tablet Take 1-2 tablets by mouth every 6 (six) hours as needed for severe pain. Patient not taking:  Reported on 12/09/2014 08/12/14   Myeong Roxine Caddy, DPM    Family History Family History  Problem Relation Age of Onset  . Diabetes Mother   . Diabetes Father     Social History Social History  Substance Use Topics  . Smoking status: Current Some Day Smoker    Packs/day: 0.50    Types: Cigarettes  . Smokeless tobacco: Never Used  . Alcohol use 0.0 oz/week     Comment: socially     Allergies   Review of patient's allergies indicates no known allergies.   Review of Systems Review of Systems  Unable to perform ROS: Acuity of condition     Physical Exam Updated Vital Signs Ht 5\' 9"  (1.753 m)   Wt 199 lb (90.3 kg)   BMI 29.39 kg/m   Physical Exam  Constitutional: He appears listless. He appears ill.  Thin, unresponsive male, sitting upright, head slumped over  HENT:  Head: Normocephalic and atraumatic.  Eyes: Conjunctivae and EOM are normal.  Cardiovascular: Normal rate and regular rhythm.   Pulmonary/Chest: Effort normal. No stridor. No respiratory distress.  Abdominal: He exhibits no distension.  Musculoskeletal: He exhibits no edema.  Neurological: He appears listless.  Patient doesn't follow neurologic commands, retracts somewhat to painful stimuli, does not follow visual commands.   Skin: Skin is warm and dry.  Psychiatric: Cognition and memory are impaired. He is noncommunicative.  Nursing note and vitals reviewed.    ED Treatments / Results  Labs (all labs ordered are listed, but only abnormal results are displayed) Labs Reviewed  URINE CULTURE  COMPREHENSIVE METABOLIC PANEL  CBC WITH DIFFERENTIAL/PLATELET  URINALYSIS, ROUTINE W REFLEX MICROSCOPIC (NOT AT [facility])  URINE RAPID DRUG SCREEN, HOSP PERFORMED  ETHANOL  I-STAT CG4 LACTIC ACID, ED    EKG  EKG Interpretation None       Radiology Ct Head Wo Contrast  Result Date: 01/14/2016 CLINICAL DATA:  Per EMS, patient found at home by family member unresponsive. Family member reports patient  taking in alcohol and cocaine. Patient's CBG is 509. EXAM: CT HEAD WITHOUT CONTRAST TECHNIQUE: Contiguous axial images were obtained from the base of the skull through the vertex without intravenous contrast. COMPARISON:  None. FINDINGS: Brain: The ventricles are normal in size and configuration. There are no parenchymal masses or mass effect. There is no evidence an infarct. There is a 2.2 x 1.4 cm CSF density extra-axial mass that indents the anterior right frontal lobe. This is consistent with an arachnoid cyst. No other extra-axial abnormalities. There is no intracranial hemorrhage. Vascular: No hyperdense vessel or unexpected calcification. Skull: No skull fracture. Sinuses/Orbits: Old medial right orbital wall fracture. Globes and orbits otherwise unremarkable. Clear visualized sinuses. Attenuation Other: None IMPRESSION: 1. No acute intracranial abnormalities. 2. Right frontal arachnoid cyst, an incidental finding. No other intracranial abnormality. Electronically Signed   By: Lajean Manes M.D.   On:  01/14/2016 08:39   Dg Chest Port 1 View  Result Date: 01/14/2016 CLINICAL DATA:  Alcohol intoxication and hyperglycemia. EXAM: PORTABLE CHEST 1 VIEW COMPARISON:  03/20/2014 FINDINGS: The heart size and mediastinal contours are within normal limits. Both lungs are clear. The visualized skeletal structures are unremarkable. IMPRESSION: No active disease. Electronically Signed   By: Nelson Chimes M.D.   On: 01/14/2016 08:13    Procedures Procedures (including critical care time)  Medications Ordered in ED Medications  sodium chloride 0.9 % bolus 1,000 mL (not administered)    And  0.9 %  sodium chloride infusion (not administered)     Initial Impression / Assessment and Plan / ED Course  I have reviewed the triage vital signs and the nursing notes.  Pertinent labs & imaging results that were available during my care of the patient were reviewed by me and considered in my medical decision making  (see chart for details).  Clinical Course  Comment By Time  Patient in similar condition, upright, no gagging, minimally responsive. Carmin Muskrat, MD 09/20 [phone]    Immediately after transfer to our monitoring equipment the patient received supplemental oxygen, IV fluids. Labs, radiology orders sent.  9:18 AM Patient remains somnolent, now slightly more responsive to sternal rub, but with minimal resistance to suctioning, repositioning. Initial labs notable for lactic acidosis, slightly elevated alcohol level.  11:23 AM Patient not moving slightly more, though he remains nonverbal. The patient's girlfriend is now present. She states that she took out involuntary commitment papers for the patient to have psychiatric evaluation. She states the patient's history of psychiatric disease, has not been treated recently for this. She states the patient drinks several pints of alcohol daily, denies history of seizures, or withdrawal. She also states that the patient came home last night about one hour prior to her calling EMS. At that point the patient was ambulatory, verbal, but one hour later, he was unresponsive.  CIWA protocol initiated IVF running.  2:46 PM Patient now following commands verbally.  He opens his eyes, but states that he won't do it.  He states that he doesn't want to talk.  4:22 PM Patient is agreeable, swinging at staff. Haldol, Ativan provided. Repeat glucose 296.  Final Clinical Impressions(s) / ED Diagnoses  Male presents unresponsive.  EMS providers. Patient has a history of depression, substantial alcohol use, poorly controlled diabetes, and cocaine use. Here the patient is initially possible to painful stimuli, has labs notable for acidosis, hyperglycemia. Tox screen positive for cocaine, and alcohol level is elevated, though not substantial. Patient received empiric fluid resuscitation, and continuous monitoring. Patient was protecting his airway, and  did not have substantial respiratory depression, intubation not immediately performed. Patient began to improve after several hours here, remained hypersomnolent 4 hours, but eventually woke up and was following commands verbally, though he remained generally  CRITICAL CARE Performed by: Carmin Muskrat Total critical care time: 40 minutes Critical care time was exclusive of separately billable procedures and treating other patients. Critical care was necessary to treat or prevent imminent or life-threatening deterioration. Critical care was time spent personally by me on the following activities: development of treatment plan with patient and/or surrogate as well as nursing, discussions with consultants, evaluation of patient's response to treatment, examination of patient, obtaining history from patient or surrogate, ordering and performing treatments and interventions, ordering and review of laboratory studies, ordering and review of radiographic studies, pulse oximetry and re-evaluation of patient's condition.  uncooperative. Patient will require additional  time to awaken further, for monitoring, prior to being cleared for psychiatric evaluation.    Carmin Muskrat, MD 01/14/16 1622

## 2016-01-14 NOTE — ED Triage Notes (Signed)
Per EMS, patient found at home by family member unresponsive. Family member reports patient taking in alcohol and cocaine. Patient's CBG is 509. Patient received 500 mL of normal saline en route with EMS.
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
## 2016-01-14 NOTE — ED Notes (Signed)
Dr Vanita Panda informed about elevated lactic.

## 2016-01-14 NOTE — ED Notes (Signed)
ATI RN at bedside while holding pt's left arm pt states "let go of my arm". Pt at times aware of surrounds however at other times thought he was at work. Pt restless in bed. Family present to witness behavior. Pt attempting to bite. Family warned the staff that this behavior was not new. EDP Vanita Panda asked pt to open his eyes, pt replied " I do not want to".

## 2016-01-14 NOTE — ED Provider Notes (Signed)
19:20 Have reviewed the case with Dr. Oletta Darter intensivist. We reviewed the history present illness and the patient's diagnostic findings and physical exam. At this time, with the patient maintaining airway and stable vital signs admit the patient to stepdown for close monitoring. Will continue to monitor for resolution of delirium and antipsychotic.  Consult placed to hospitalist.   Charlesetta Shanks, MD 01/14/16 [phone]

## 2016-01-14 NOTE — ED Notes (Signed)
IVC PAPERS

## 2016-01-14 NOTE — ED Notes (Addendum)
MD at bedside. EDP LOCKWWOD PRESENT SPEAKING WITH FAMILY. NO CHANGES IN NEURO STATUS WITH PT.

## 2016-01-14 NOTE — ED Provider Notes (Signed)
Patient's labs have shown improvement. Patient's condition however remains obtunded with deep respirations. Patient's mouth is very dry. He does smell ketotic. Pupils are 1-2 mm.  Again reviewing the patient's history, he was unresponsive and intermittently apneic upon EMS arrival. During his stay in the emergency department, once he was more awake, patient was still profoundly confused. His nurse reports that when he would respond he thought he was at work and was combative. Description is consistent with delirium.  CT head was negative for acute trauma. Will consult intensivist regarding the possibility of anoxic or cocaine-related brain injury versus prolonged delirium secondary to drug intoxication. Patient will require hospitalization for observation.   Charlesetta Shanks, MD 01/14/16 [phone]

## 2016-01-14 NOTE — ED Notes (Signed)
IVC paperwork taken out on this patient. Placed in patient's chart. Primary RN aware.

## 2016-01-14 NOTE — Progress Notes (Signed)
Pt is not alert nor oriented Pt noted with attempts to get out of bed during Cm earlier visit. Pt's mother is not aware of pt's pcp name Mother states pt has not taken medications in over a year nor seen a MD.   Pt noted to be snoring at this time  Pt mother and brother at bedside

## 2016-01-14 NOTE — ED Notes (Signed)
CIWA UNABLE TO ASSESS. GIRLFRIEND STATES DRINKS HEAVY DAILY. EDP MADE AWARE.

## 2016-01-14 NOTE — ED Notes (Addendum)
MD at bedside. EDP PFEIFER PRESENT SPEAKING WITH FAMILY

## 2016-01-14 NOTE — ED Notes (Signed)
Tyler Green MADE AWARE OF VITAL SIGNS,, OXYGEN 2 LITER CONTINUOUS, MENTATION, AND PLAN OF CARE. ? MEDICAL ADMISSION FOR THIS PT. NO ORDER GIVEN AT THIS TIME. CHARGE STACEY RN MADE AWARE

## 2016-01-14 NOTE — ED Notes (Signed)
Low BP pt on side. Pt repositioned. BP [phone]

## 2016-01-14 NOTE — ED Provider Notes (Signed)
I have assumed care of the patient from Dr. Vanita Panda. Reportedly the patient was found by his girlfriend very poorly responsive. He reportedly had been drinking and using cocaine. Diagnostic evaluation included CT head which did not show acute injury. Patient is diabetic and found to be hyper glycemic. Fluids were administered to 3 L. The patient remained very somnolent but ultimately became combative and at that time was given Haldol. Dr. Vanita Panda advised the patient was still being observed for resolution of mental status change.  17:10 I have examined the patient and find that he is very somnolent and obtunded. He does not awaken to stimulus. He is snoring loudly. Pupils are approximately 2 mm. Mucous membranes are dry. On the monitor patient has sinus rhythm at approximately 105. Breath sounds are symmetric. No obvious physical injury or extremity deformity. Skin is warm and dry.  Patient's condition is for significant mental status change and obtundation. He has been hyperglycemic with untreated diabetes. I will recheck  blood sugar and lactic acid. I will add a Tylenol and salicylate level. At this point, plan will be for admission for continued mental status change despite 9 hours of observation.   Tyler Shanks, MD 01/14/16 [phone]

## 2016-01-14 NOTE — ED Notes (Signed)
Security placed family belonging in locker 24

## 2016-01-14 NOTE — ED Notes (Signed)
Bed: RESA Expected date:  Expected time:  Means of arrival:  Comments: EMS-ETOH

## 2016-01-15 ENCOUNTER — Inpatient Hospital Stay (HOSPITAL_COMMUNITY)
Admit: 2016-01-15 | Discharge: 2016-01-15 | Disposition: A | Discharge status: Home / Self Care | Payer: BLUE CROSS/BLUE SHIELD | Attending: Internal Medicine | Admitting: Internal Medicine

## 2016-01-15 ENCOUNTER — Encounter (HOSPITAL_COMMUNITY): Payer: Self-pay

## 2016-01-15 DIAGNOSIS — J9601 Acute respiratory failure with hypoxia: Secondary | ICD-10-CM

## 2016-01-15 DIAGNOSIS — E872 Acidosis, unspecified: Secondary | ICD-10-CM

## 2016-01-15 DIAGNOSIS — F1910 Other psychoactive substance abuse, uncomplicated: Secondary | ICD-10-CM

## 2016-01-15 DIAGNOSIS — R401 Stupor: Secondary | ICD-10-CM

## 2016-01-15 DIAGNOSIS — G9340 Encephalopathy, unspecified: Secondary | ICD-10-CM

## 2016-01-15 DIAGNOSIS — R404 Transient alteration of awareness: Secondary | ICD-10-CM

## 2016-01-15 LAB — CBC WITH DIFFERENTIAL/PLATELET
Basophils Absolute: 0.1 10*3/uL (ref 0.0–0.1)
Basophils Relative: 0 %
EOS ABS: 0 10*3/uL (ref 0.0–0.7)
EOS PCT: 0 %
HCT: 40.6 % (ref 39.0–52.0)
Hemoglobin: 14.5 g/dL (ref 13.0–17.0)
LYMPHS ABS: 2.6 10*3/uL (ref 0.7–4.0)
Lymphocytes Relative: 18 %
MCH: 33 pg (ref 26.0–34.0)
MCHC: 35.7 g/dL (ref 30.0–36.0)
MCV: 92.5 fL (ref 78.0–100.0)
MONO ABS: 1 10*3/uL (ref 0.1–1.0)
Monocytes Relative: 7 %
Neutro Abs: 10.5 10*3/uL — ABNORMAL HIGH (ref 1.7–7.7)
Neutrophils Relative %: 75 %
PLATELETS: 265 10*3/uL (ref 150–400)
RBC: 4.39 MIL/uL (ref 4.22–5.81)
RDW: 12.3 % (ref 11.5–15.5)
WBC: 14.1 10*3/uL — AB (ref 4.0–10.5)

## 2016-01-15 LAB — MRSA PCR SCREENING: MRSA BY PCR: NEGATIVE

## 2016-01-15 LAB — CBG MONITORING, ED
GLUCOSE-CAPILLARY: 182 mg/dL — AB (ref 65–99)
GLUCOSE-CAPILLARY: 197 mg/dL — AB (ref 65–99)
GLUCOSE-CAPILLARY: 175 mg/dL — AB (ref 65–99)
GLUCOSE-CAPILLARY: 205 mg/dL — AB (ref 65–99)
Glucose-Capillary: 191 mg/dL — ABNORMAL HIGH (ref 65–99)
Glucose-Capillary: 216 mg/dL — ABNORMAL HIGH (ref 65–99)
Glucose-Capillary: 223 mg/dL — ABNORMAL HIGH (ref 65–99)
Glucose-Capillary: 241 mg/dL — ABNORMAL HIGH (ref 65–99)

## 2016-01-15 LAB — URINE CULTURE
CULTURE: NO GROWTH
SPECIAL REQUESTS: NORMAL

## 2016-01-15 LAB — MAGNESIUM: MAGNESIUM: 1.8 mg/dL (ref 1.7–2.4)

## 2016-01-15 LAB — GLUCOSE, CAPILLARY: GLUCOSE-CAPILLARY: 165 mg/dL — AB (ref 65–99)

## 2016-01-15 MED ORDER — INSULIN ASPART 100 UNIT/ML ~~LOC~~ SOLN
5.0000 [IU] | Freq: Once | SUBCUTANEOUS | Status: AC
Start: 2016-01-15 — End: 2016-01-15
  Administered 2016-01-15: 5 [IU] via SUBCUTANEOUS
  Filled 2016-01-15: qty 1

## 2016-01-15 MED ORDER — INSULIN ASPART PROT & ASPART (70-30 MIX) 100 UNIT/ML ~~LOC~~ SUSP
5.0000 [IU] | Freq: Once | SUBCUTANEOUS | Status: DC
  Filled 2016-01-15: qty 10

## 2016-01-15 MED ORDER — SODIUM CHLORIDE 0.9 % IV SOLN
INTRAVENOUS | Status: DC
  Administered 2016-01-15: 16:00:00 via INTRAVENOUS

## 2016-01-15 NOTE — ED Notes (Signed)
Patient woke, pulled all leads off, disconnected iv and resisted care when writer and charged tried to assist patient back into bed, patient became combative and more resistant to care. Provider paged for safety restraints. Sitter now at bedside.

## 2016-01-15 NOTE — ED Notes (Signed)
EEG at bedside.

## 2016-01-15 NOTE — ED Notes (Signed)
Place condom cath on pt. 

## 2016-01-15 NOTE — Procedures (Signed)
ELECTROENCEPHALOGRAM REPORT  Date of Study: 01/15/2016  Patient's Name: Tyler Green MRN: [id number] Date of Birth: [birth year]  Referring Provider: Dr. Berle Mull  Clinical History: This is a 40 year old man admitted for unresponsiveness.  Medications: acetaminophen (TYLENOL) tablet 650 mg  enoxaparin (LOVENOX) injection 40 mg  insulin aspart (novoLOG) injection 1-3 Units  LORazepam (ATIVAN) injection 2-3 mg  thiamine (B-1) injection 100 mg   Technical Summary: A multichannel digital EEG recording measured by the international 10-20 system with electrodes applied with paste and impedances below 5000 ohms performed in our laboratory with EKG monitoring in a predominantly drowsy and asleep patient.  Hyperventilation and photic stimulation were not performed.  The digital EEG was referentially recorded, reformatted, and digitally filtered in a variety of bipolar and referential montages for optimal display.    Description: The patient is predominantly drowsy and asleep during the recording.  During maximal wakefulness, there is a symmetric, medium voltage 9 Hz posterior dominant rhythm that attenuates with eye opening.  The record is symmetric.  During drowsiness and sleep, there is an increase in theta slowing of the background.  Vertex waves and symmetric sleep spindles were seen.  Hyperventilation and photic stimulation were not performed.  There were no epileptiform discharges or electrographic seizures seen.    EKG lead was unremarkable.  Impression: This predominantly drowsy and asleep EEG is normal.    Clinical Correlation: A normal EEG does not exclude a clinical diagnosis of epilepsy. Clinical correlation is advised.   Ellouise Newer, M.D.

## 2016-01-15 NOTE — Progress Notes (Signed)
Entered in d/c instructions Please use the copies of [employer] providers in network on your plan (given to your mother by ED case Manger) to find doctors for follow up care     These are lists of internal medicine, psychiatry, psychologist and detox rehabe center in network on your [employer] plan

## 2016-01-15 NOTE — Progress Notes (Signed)
Pt's mother copies [employer] Options lists of providers for internal medicine doctors, psychology, detox center and psychiatry  Pt still remains asleep during each of ED CM visits today  Sitter at bedside Mother and brother have been at bedside

## 2016-01-15 NOTE — ED Notes (Signed)
Spoke with Izora Gala from EEG and she will be over shortly to perform pt's EEG.

## 2016-01-15 NOTE — Progress Notes (Signed)
Offsite EEG completed at [facility] ED.  Results pending.

## 2016-01-15 NOTE — Progress Notes (Signed)
PROGRESS NOTE    Tyler Green  [id number] DOB: [birth year] DOA: 01/14/2016  PCP: No primary care provider on file.   Brief Narrative:  40 year old male with diabetes mellitus, hypertension and history of substance abuse. The patient went out to meet a friend on the night prior to coming to the hospital- when he got home early in the morning around 6 AM, he fell asleep. When his girlfriend went to awaken him, he would not awake and therefore he was sent to the ER. He is unable to give history but his family states that he has used cocaine in the past. Urine drug screen is positive for cocaine only. Medication list mentions that he takes Benadryl. After speaking with his girlfriend who is looked in his room it appears that his Benadryl bottle was empty. She states in the past he has taken up to 3 or 4 tablets to help him sleep. She admits that he is very depressed lately and she thinks he may have done "whenever he did on purpose".  Subjective: Unresponsive  Assessment & Plan:   Principal Problem:   Acute encephalopathy - ? Diphenhydramine overdose-UDS positive for cocaine-no history of any other drug abuse as far as the family is aware -EEG reveals generalized slowing - Continue to follow - has been agitated and was given Haldol in the ER yesterday - hold off on further sedative/antipsychotics for now  Active Problems:   Depression - ?suicide attempt -  cont sitter - will ask for a psych eval when he is more alert    Substance abuse - cocaine +    Acute respiratory failure with hypoxia  - cont O2 as needed- CXR was negative but may have aspirated when he was obtunded- follow for fevers- if still hypoxic tomorrow, will repeat CXR or consider CT chest  DM - per girlfriend, he has not been taking his Metformin - cont ISS - check A1c  HTN - on Lisinopril at home- hold for now    Lactic acidosis   DVT prophylaxis: Lovenox Code Status: Full code Family Communication:  mother, brother, girlfriend Disposition Plan: home when stable Consultants:   none Procedures:    Antimicrobials:  Anti-infectives    None       Objective: Vitals:   01/15/16 1457 01/15/16 1500 01/15/16 1544 01/15/16 1600  BP: 117/80 120/69 105/75 118/81  Pulse: 74 86 84 83  Resp: 18 22 18 17   Temp:      TempSrc:      SpO2: 100% 98% 100% 100%  Weight:    59 kg (130 lb 1.1 oz)  Height:    5\' 8"  (1.727 m)    Intake/Output Summary (Last 24 hours) at 01/15/16 1649 Last data filed at 01/15/16 1622  Gross per 24 hour  Intake             1000 ml  Output              600 ml  Net              400 ml   Filed Weights   01/14/16 0747 01/15/16 1600  Weight: 90.3 kg (199 lb) 59 kg (130 lb 1.1 oz)    Examination: General exam: sleeping- turns when trying to open his eyes- does not answer questions or open eyes HEENT: PERRLA, oral mucosa moist, no sclera icterus or thrush Respiratory system: Clear to auscultation. Respiratory effort normal. Cardiovascular system: S1 & S2 heard, RRR.  No murmurs  Gastrointestinal  system: Abdomen soft, non-tender, nondistended. Normal bowel sound. No organomegaly Central nervous system: sleepy with intermittent agitation-refuses to open eyes-  in soft wrist restrains- moving arms and legs and trying to get out of restraints -   No focal neurological deficits. Extremities: No cyanosis, clubbing or edema Skin: No rashes or ulcers Psychiatry:  Unable to determine- not responsive     Data Reviewed: I have personally reviewed following labs and imaging studies  CBC:  Recent Labs Lab 01/14/16 0758 01/15/16 0455  WBC 9.2 14.1*  NEUTROABS 5.6 10.5*  HGB 15.7 14.5  HCT 43.5 40.6  MCV 91.2 92.5  PLT 255 99991111   Basic Metabolic Panel:  Recent Labs Lab 01/14/16 0758 01/14/16 1726 01/15/16 0455  NA 138 144  --   K 3.9 3.8  --   CL 103 112*  --   CO2 20* 26  --   GLUCOSE 437* 272*  --   BUN 13 10  --   CREATININE 0.86 1.23  --     CALCIUM 8.5* 8.3*  --   MG  --   --  1.8   GFR: Estimated Creatinine Clearance: 67.3 mL/min (by C-G formula based on SCr of 1.23 mg/dL). Liver Function Tests:  Recent Labs Lab 01/14/16 0758  AST 12*  ALT 18  ALKPHOS 57  BILITOT 0.2*  PROT 6.7  ALBUMIN 4.3   No results for input(s): LIPASE, AMYLASE in the last 168 hours. No results for input(s): AMMONIA in the last 168 hours. Coagulation Profile: No results for input(s): INR, PROTIME in the last 168 hours. Cardiac Enzymes:  Recent Labs Lab 01/14/16 0758 01/14/16 1726  CKTOTAL 56 107  TROPONINI  --  <0.03   BNP (last 3 results) No results for input(s): PROBNP in the last 8760 hours. HbA1C: No results for input(s): HGBA1C in the last 72 hours. CBG:  Recent Labs Lab 01/15/16 0809 01/15/16 0953 01/15/16 1123 01/15/16 1222 01/15/16 1602  GLUCAP 223* 191* 197* 175* 205*   Lipid Profile: No results for input(s): CHOL, HDL, LDLCALC, TRIG, CHOLHDL, LDLDIRECT in the last 72 hours. Thyroid Function Tests: No results for input(s): TSH, T4TOTAL, FREET4, T3FREE, THYROIDAB in the last 72 hours. Anemia Panel: No results for input(s): VITAMINB12, FOLATE, FERRITIN, TIBC, IRON, RETICCTPCT in the last 72 hours. Urine analysis:    Component Value Date/Time   COLORURINE YELLOW 01/14/2016 [place] 01/14/2016 0941   LABSPEC 1.020 01/14/2016 0941   PHURINE 5.5 01/14/2016 0941   GLUCOSEU >1000 (A) 01/14/2016 0941   HGBUR NEGATIVE 01/14/2016 0941   BILIRUBINUR NEGATIVE 01/14/2016 0941   BILIRUBINUR neg 12/09/2014 1400   KETONESUR 15 (A) 01/14/2016 0941   PROTEINUR NEGATIVE 01/14/2016 0941   UROBILINOGEN 0.2 12/09/2014 1400   UROBILINOGEN 1.0 04/09/2010 1600   NITRITE NEGATIVE 01/14/2016 0941   LEUKOCYTESUR NEGATIVE 01/14/2016 0941   Sepsis Labs: @LABRCNTIP (procalcitonin:4,lacticidven:4) ) Recent Results (from the past 240 hour(s))  Urine culture     Status: None   Collection Time: 01/14/16  9:41 AM   Result Value Ref Range Status   Specimen Description URINE, CATHETERIZED  Final   Special Requests unknown Normal  Final   Culture NO GROWTH Performed at [facility]   Final   Report Status 01/15/2016 FINAL  Final         Radiology Studies: Ct Head Wo Contrast  Result Date: 01/14/2016 CLINICAL DATA:  Per EMS, patient found at home by family member unresponsive. Family member reports patient taking in alcohol  and cocaine. Patient's CBG is 509. EXAM: CT HEAD WITHOUT CONTRAST TECHNIQUE: Contiguous axial images were obtained from the base of the skull through the vertex without intravenous contrast. COMPARISON:  None. FINDINGS: Brain: The ventricles are normal in size and configuration. There are no parenchymal masses or mass effect. There is no evidence an infarct. There is a 2.2 x 1.4 cm CSF density extra-axial mass that indents the anterior right frontal lobe. This is consistent with an arachnoid cyst. No other extra-axial abnormalities. There is no intracranial hemorrhage. Vascular: No hyperdense vessel or unexpected calcification. Skull: No skull fracture. Sinuses/Orbits: Old medial right orbital wall fracture. Globes and orbits otherwise unremarkable. Clear visualized sinuses. Attenuation Other: None IMPRESSION: 1. No acute intracranial abnormalities. 2. Right frontal arachnoid cyst, an incidental finding. No other intracranial abnormality. Electronically Signed   By: Lajean Manes M.D.   On: 01/14/2016 08:39   Dg Chest Port 1 View  Result Date: 01/14/2016 CLINICAL DATA:  Alcohol intoxication and hyperglycemia. EXAM: PORTABLE CHEST 1 VIEW COMPARISON:  03/20/2014 FINDINGS: The heart size and mediastinal contours are within normal limits. Both lungs are clear. The visualized skeletal structures are unremarkable. IMPRESSION: No active disease. Electronically Signed   By: Nelson Chimes M.D.   On: 01/14/2016 08:13      Scheduled Meds: . enoxaparin (LOVENOX) injection  40 mg  Subcutaneous Q24H  . insulin aspart  1-3 Units Subcutaneous Q4H  . sodium chloride flush  3 mL Intravenous Q12H  . thiamine  100 mg Intravenous Daily   Continuous Infusions: . sodium chloride 100 mL/hr at 01/15/16 1622     LOS: 1 day    Time spent in minutes: 23    Osborne, MD [facility] Pager: [email or web address] Password [facility] 01/15/2016, 4:49 PM

## 2016-01-15 NOTE — ED Notes (Signed)
Pt currently in gurney cuffs upon arrival to room. Pt taken out of orange and black gurney cuffs and placed in white and blue soft restraints, safety, non-violent.

## 2016-01-15 NOTE — Progress Notes (Signed)
Some of the admission documentation could not be completed due to lethargy and unresponsiveness of the patient

## 2016-01-15 NOTE — ED Notes (Signed)
Pharmacy called about lovenox, stated they would send up.

## 2016-01-16 LAB — GLUCOSE, CAPILLARY
GLUCOSE-CAPILLARY: 190 mg/dL — AB (ref 65–99)
GLUCOSE-CAPILLARY: 289 mg/dL — AB (ref 65–99)
Glucose-Capillary: 210 mg/dL — ABNORMAL HIGH (ref 65–99)
Glucose-Capillary: 327 mg/dL — ABNORMAL HIGH (ref 65–99)

## 2016-01-16 LAB — CBC
HCT: 36.1 % — ABNORMAL LOW (ref 39.0–52.0)
HEMOGLOBIN: 12.7 g/dL — AB (ref 13.0–17.0)
MCH: 32.8 pg (ref 26.0–34.0)
MCHC: 35.2 g/dL (ref 30.0–36.0)
MCV: 93.3 fL (ref 78.0–100.0)
Platelets: 183 10*3/uL (ref 150–400)
RBC: 3.87 MIL/uL — ABNORMAL LOW (ref 4.22–5.81)
RDW: 12.2 % (ref 11.5–15.5)
WBC: 8.5 10*3/uL (ref 4.0–10.5)

## 2016-01-16 LAB — BASIC METABOLIC PANEL
ANION GAP: 7 (ref 5–15)
BUN: 8 mg/dL (ref 6–20)
CALCIUM: 8.7 mg/dL — AB (ref 8.9–10.3)
CO2: 24 mmol/L (ref 22–32)
Chloride: 110 mmol/L (ref 101–111)
Creatinine, Ser: 0.78 mg/dL (ref 0.61–1.24)
GFR calc Af Amer: >60 mL/min (ref >60)
GLUCOSE: 199 mg/dL — AB (ref 65–99)
Potassium: 3.5 mmol/L (ref 3.5–5.1)
SODIUM: 141 mmol/L (ref 135–145)

## 2016-01-16 MED ORDER — ATORVASTATIN CALCIUM 40 MG PO TABS: 40.0000 mg | ORAL_TABLET | Freq: Every day | ORAL | 0 refills | Status: DC

## 2016-01-16 MED ORDER — METFORMIN HCL ER 500 MG PO TB24: 500.0000 mg | ORAL_TABLET | Freq: Every day | ORAL | 0 refills | Status: DC

## 2016-01-16 MED ORDER — LIP MEDEX EX OINT
TOPICAL_OINTMENT | CUTANEOUS | Status: DC | PRN
  Administered 2016-01-16: 1 via TOPICAL
  Filled 2016-01-16: qty 7

## 2016-01-16 MED ORDER — LISINOPRIL 10 MG PO TABS: 10.0000 mg | ORAL_TABLET | Freq: Every day | ORAL | 0 refills | Status: DC

## 2016-01-16 MED ORDER — IBUPROFEN 200 MG PO TABS: 400.0000 mg | ORAL_TABLET | Freq: Three times a day (TID) | ORAL | 0 refills | Status: DC | PRN

## 2016-01-16 NOTE — Progress Notes (Signed)
IVC commitment of change completed and faxed to [place]. Form placed in patient chart.

## 2016-01-16 NOTE — Care Management Note (Signed)
Case Management Note  Patient Details  Name: Tyler Green MRN: [id number] Date of Birth: May 01, 1975  Subjective/Objective:          etoh withdrawal          Action/Plan: tbd   Expected Discharge Date:   (unknown)               Expected Discharge Plan:     In-House Referral:     Discharge planning Services     Post Acute Care Choice:    Choice offered to:     DME Arranged:    DME Agency:     HH Arranged:    HH Agency:     Status of Service:     If discussed at [employer] of Stay Meetings, dates discussed:    Additional Comments:Date:  January 16, 2016 Chart reviewed for concurrent status and case management needs. Will continue to follow the patient for changes and needs: Discharge Planning: following for needs Velva Harman, BSN, [place], [place]  Leeroy Cha, RN 01/16/2016, 10:57 AM

## 2016-01-16 NOTE — Discharge Summary (Addendum)
Physician Discharge Summary  DEKE RENCH [id number] DOB: Jan 21, 1976 DOA: 01/14/2016  PCP: No primary care provider on file.  Admit date: 01/14/2016 Discharge date: 01/16/2016  Admitted From: home  Disposition:  home    Discharge Condition:  stable   CODE STATUS:  Full code   Diet recommendation:  Heart healthy, diabetic diet Consultations:  none    Discharge Diagnoses:  Principal Problem:   Acute encephalopathy Active Problems:   Obtundation   Substance abuse   Acute respiratory failure with hypoxia (HCC)   Lactic acidosis    Subjective: Alert, without complaints of nausea, vomiting, cough, pain, diarrhea, constipation.  Brief Summary: 40 year old male with diabetes mellitus, hypertension and history of substance abuse. The patient went out to meet a friend on the night prior to coming to the hospital- when he got home early in the morning around 6 AM, he fell asleep. When his girlfriend went to awaken him, he would not awake and therefore he was sent to the ER. He is unable to give history but his family states that he has used cocaine in the past. Urine drug screen is positive for cocaine only. Medication list mentions that he takes Benadryl. After speaking with his girlfriend who has looked in his room, it appears that his Benadryl bottle was empty. She states in the past he has taken up to 3 or 4 tablets to help him sleep. She admits that he has appeared depressed lately and she thinks he may have done "whenever he did on purpose".   Hospital Course:  Principal Problem:   Acute encephalopathy- Cocaine abuse - ? Diphenhydramine overdose as his girlfriend states that there was an empty bottle of Benadryl - UDS positive for cocaine-no history of any other drug abuse as far as the family is aware -EEG reveals generalized slowing - he remained quite sedated until 7 PM last night - alert enough to give a history this morning. Admits only to smoking Cocaine on the night  prior to admission. He is unsure if it was mixed with another substance. Does not admit to taking any Benadryl or other medications.   Depression - Admits to mild depression but does not feel suicidal and does not feel he needs treatment for depression.   Substance abuse - cocaine + - counseled to discontinue    Lactic acidosis - on admission - resolved  Acute respiratory failure with hypoxia  - initially requiring O 2 via nasal cannula- once fully alert, he is no longer hypoxic - CXR on admission unrevealing  DM 2 - per girlfriend, he has not been taking his Metformin- once alert, he admits that he has not taken it in over 1 yr - I have talked to him about taking medications appropriately, adhering to a diabetic diet and seeing a PCP regularly for follow up- have given him a new prescription   HTN - he was prescribed Lisinopril but has not been taking it- have given him a new prescription     Discharge Instructions  Discharge Instructions    Discharge instructions    Complete by:  As directed    Low sodium, heart healthy, diabetic diet   Increase activity slowly    Complete by:  As directed        Medication List    STOP taking these medications   diphenhydrAMINE 25 MG tablet Commonly known as:  BENADRYL     TAKE these medications   acetaminophen 500 MG tablet Commonly known as:  TYLENOL  Take 1,000 mg by mouth once as needed. For pain   atorvastatin 40 MG tablet Commonly known as:  LIPITOR Take 1 tablet (40 mg total) by mouth daily at 6 PM.   ibuprofen 200 MG tablet Commonly known as:  ADVIL,MOTRIN Take 2 tablets (400 mg total) by mouth every 8 (eight) hours as needed. For pain What changed:  how much to take   lisinopril 10 MG tablet Commonly known as:  PRINIVIL,ZESTRIL Take 1 tablet (10 mg total) by mouth daily.   metFORMIN 500 MG 24 hr tablet Commonly known as:  GLUCOPHAGE-XR Take 1 tablet (500 mg total) by mouth daily with supper.    RANITIDINE HCL PO Take 1 tablet by mouth daily as needed (indigestion).      Follow-up Information    Please use the copies of [facility] providers in network on your plan (given to your mother by ED case Manger) to find doctors for follow up care. Schedule an appointment as soon as possible for a visit today.   Contact information: These are lists of internal medicine, psychiatry, psychologist and detox rehabe center in network on your [employer] plan          No Known Allergies   Procedures/Studies:  Ct Head Wo Contrast  Result Date: 01/14/2016 CLINICAL DATA:  Per EMS, patient found at home by family member unresponsive. Family member reports patient taking in alcohol and cocaine. Patient's CBG is 509. EXAM: CT HEAD WITHOUT CONTRAST TECHNIQUE: Contiguous axial images were obtained from the base of the skull through the vertex without intravenous contrast. COMPARISON:  None. FINDINGS: Brain: The ventricles are normal in size and configuration. There are no parenchymal masses or mass effect. There is no evidence an infarct. There is a 2.2 x 1.4 cm CSF density extra-axial mass that indents the anterior right frontal lobe. This is consistent with an arachnoid cyst. No other extra-axial abnormalities. There is no intracranial hemorrhage. Vascular: No hyperdense vessel or unexpected calcification. Skull: No skull fracture. Sinuses/Orbits: Old medial right orbital wall fracture. Globes and orbits otherwise unremarkable. Clear visualized sinuses. Attenuation Other: None IMPRESSION: 1. No acute intracranial abnormalities. 2. Right frontal arachnoid cyst, an incidental finding. No other intracranial abnormality. Electronically Signed   By: Lajean Manes M.D.   On: 01/14/2016 08:39   Dg Chest Port 1 View  Result Date: 01/14/2016 CLINICAL DATA:  Alcohol intoxication and hyperglycemia. EXAM: PORTABLE CHEST 1 VIEW COMPARISON:  03/20/2014 FINDINGS: The heart size and mediastinal contours are within normal limits.  Both lungs are clear. The visualized skeletal structures are unremarkable. IMPRESSION: No active disease. Electronically Signed   By: Nelson Chimes M.D.   On: 01/14/2016 08:13        Discharge Exam: Vitals:   01/16/16 0808 01/16/16 1130  BP:    Pulse:    Resp:    Temp: 98 F (36.7 C) 98.8 F (37.1 C)   Vitals:   01/16/16 0805 01/16/16 0807 01/16/16 0808 01/16/16 1130  BP: 133/87 133/87    Pulse: 65 62    Resp: 20 18    Temp:   98 F (36.7 C) 98.8 F (37.1 C)  TempSrc:   Oral Oral  SpO2: 98% 97%    Weight:      Height:        General: Pt is alert, awake, not in acute distress Cardiovascular: RRR, S1/S2 +, no rubs, no gallops Respiratory: CTA bilaterally, no wheezing, no rhonchi Abdominal: Soft, NT, ND, bowel sounds + Extremities: no edema, no  cyanosis    The results of significant diagnostics from this hospitalization (including imaging, microbiology, ancillary and laboratory) are listed below for reference.     Microbiology: Recent Results (from the past 240 hour(s))  Urine culture     Status: None   Collection Time: 01/14/16  9:41 AM  Result Value Ref Range Status   Specimen Description URINE, CATHETERIZED  Final   Special Requests unknown Normal  Final   Culture NO GROWTH Performed at [facility]   Final   Report Status 01/15/2016 FINAL  Final  MRSA PCR Screening     Status: None   Collection Time: 01/15/16  4:38 PM  Result Value Ref Range Status   MRSA by PCR NEGATIVE NEGATIVE Final    Comment:        The GeneXpert MRSA Assay (FDA approved for NASAL specimens only), is one component of a comprehensive MRSA colonization surveillance program. It is not intended to diagnose MRSA infection nor to guide or monitor treatment for MRSA infections.      Labs: BNP (last 3 results) No results for input(s): BNP in the last 8760 hours. Basic Metabolic Panel:  Recent Labs Lab 01/14/16 0758 01/14/16 1726 01/15/16 0455 01/16/16 0325  NA 138  144  --  141  K 3.9 3.8  --  3.5  CL 103 112*  --  110  CO2 20* 26  --  24  GLUCOSE 437* 272*  --  199*  BUN 13 10  --  8  CREATININE 0.86 1.23  --  0.78  CALCIUM 8.5* 8.3*  --  8.7*  MG  --   --  1.8  --    Liver Function Tests:  Recent Labs Lab 01/14/16 0758  AST 12*  ALT 18  ALKPHOS 57  BILITOT 0.2*  PROT 6.7  ALBUMIN 4.3   No results for input(s): LIPASE, AMYLASE in the last 168 hours. No results for input(s): AMMONIA in the last 168 hours. CBC:  Recent Labs Lab 01/14/16 0758 01/15/16 0455 01/16/16 0325  WBC 9.2 14.1* 8.5  NEUTROABS 5.6 10.5*  --   HGB 15.7 14.5 12.7*  HCT 43.5 40.6 36.1*  MCV 91.2 92.5 93.3  PLT 255 265 183   Cardiac Enzymes:  Recent Labs Lab 01/14/16 0758 01/14/16 1726  CKTOTAL 56 107  TROPONINI  --  <0.03   BNP: Invalid input(s): POCBNP CBG:  Recent Labs Lab 01/15/16 2029 01/16/16 0014 01/16/16 0332 01/16/16 0752 01/16/16 1129  GLUCAP 165* 289* 190* 210* 327*   D-Dimer No results for input(s): DDIMER in the last 72 hours. Hgb A1c No results for input(s): HGBA1C in the last 72 hours. Lipid Profile No results for input(s): CHOL, HDL, LDLCALC, TRIG, CHOLHDL, LDLDIRECT in the last 72 hours. Thyroid function studies No results for input(s): TSH, T4TOTAL, T3FREE, THYROIDAB in the last 72 hours.  Invalid input(s): FREET3 Anemia work up No results for input(s): VITAMINB12, FOLATE, FERRITIN, TIBC, IRON, RETICCTPCT in the last 72 hours. Urinalysis    Component Value Date/Time   COLORURINE YELLOW 01/14/2016 [place] 01/14/2016 0941   LABSPEC 1.020 01/14/2016 0941   PHURINE 5.5 01/14/2016 0941   GLUCOSEU >1000 (A) 01/14/2016 0941   HGBUR NEGATIVE 01/14/2016 0941   BILIRUBINUR NEGATIVE 01/14/2016 0941   BILIRUBINUR neg 12/09/2014 1400   KETONESUR 15 (A) 01/14/2016 0941   PROTEINUR NEGATIVE 01/14/2016 0941   UROBILINOGEN 0.2 12/09/2014 1400   UROBILINOGEN 1.0 04/09/2010 1600   NITRITE NEGATIVE 01/14/2016  0941  LEUKOCYTESUR NEGATIVE 01/14/2016 0941   Sepsis Labs Invalid input(s): PROCALCITONIN,  WBC,  LACTICIDVEN Microbiology Recent Results (from the past 240 hour(s))  Urine culture     Status: None   Collection Time: 01/14/16  9:41 AM  Result Value Ref Range Status   Specimen Description URINE, CATHETERIZED  Final   Special Requests unknown Normal  Final   Culture NO GROWTH Performed at [facility]   Final   Report Status 01/15/2016 FINAL  Final  MRSA PCR Screening     Status: None   Collection Time: 01/15/16  4:38 PM  Result Value Ref Range Status   MRSA by PCR NEGATIVE NEGATIVE Final    Comment:        The GeneXpert MRSA Assay (FDA approved for NASAL specimens only), is one component of a comprehensive MRSA colonization surveillance program. It is not intended to diagnose MRSA infection nor to guide or monitor treatment for MRSA infections.      Time coordinating discharge: Over 30 minutes  SIGNED:   Debbe Odea, MD  [facility] 01/16/2016, 11:59 AM Pager   If 7PM-7AM, please contact night-coverage [email or web address] Password [email or web address]

## 2016-01-16 NOTE — Progress Notes (Signed)
Inpatient Diabetes Program Recommendations  AACE/ADA: New Consensus Statement on Inpatient Glycemic Control (2015)  Target Ranges:  Prepandial:   less than 140 mg/dL      Peak postprandial:   less than 180 mg/dL (1-2 hours)      Critically ill patients:  140 - 180 mg/dL   Results for KENG, ROMANOS (MRN [id number]) as of 01/16/2016 09:56  Ref. Range 01/15/2016 00:27 01/15/2016 04:18 01/15/2016 06:52 01/15/2016 08:09 01/15/2016 09:53 01/15/2016 11:23 01/15/2016 12:22 01/15/2016 16:02 01/15/2016 20:29  Glucose-Capillary Latest Ref Range: 65 - 99 mg/dL 216 (H) 241 (H) 182 (H) 223 (H) 191 (H) 197 (H) 175 (H) 205 (H) 165 (H)   Results for PAWEL, STVIL (MRN [id number]) as of 01/16/2016 09:56  Ref. Range 01/16/2016 00:14 01/16/2016 03:32 01/16/2016 07:52  Glucose-Capillary Latest Ref Range: 65 - 99 mg/dL 289 (H) 190 (H) 210 (H)    Admit with: AMS/ UDS positive for Cocaine  History: DM, Polysubstance Abuse  Home DM Meds: Metformin 500 mg QPM (not taking per report from girlfriend)  Current Insulin Orders: Novolog 1-2-3 Q4 hour per ICU Glycemic Control Protocol     -Note patient now on solid PO diet.  -Having glucose levels >200 mg/dl    MD- Please consider the following in-hospital insulin adjustments:  1. Discontinue ICU Glycemic Control Protocol (Novolog 1-2-3 Q4 hours)  2. Start Novolog Moderate Correction Scale/ SSI (0-15 units) TID AC + HS (per Glycemic Control order set)  3. Consider starting low dose basal insulin while home Metformin on hold- Lantus 10 units QHS (0.15 units/kg dosing)  4. Please consider ordering A1c level to assess glucose control prior to hospitalization     --Will follow patient during hospitalization--  Wyn Quaker RN, MSN, CDE Diabetes Coordinator Inpatient Glycemic Control Team Team Pager: [phone] (8a-5p)

## 2016-02-03 ENCOUNTER — Encounter (HOSPITAL_BASED_OUTPATIENT_CLINIC_OR_DEPARTMENT_OTHER): Payer: Self-pay | Admitting: *Deleted

## 2016-02-03 ENCOUNTER — Emergency Department (HOSPITAL_BASED_OUTPATIENT_CLINIC_OR_DEPARTMENT_OTHER): Payer: BLUE CROSS/BLUE SHIELD

## 2016-02-03 ENCOUNTER — Emergency Department (HOSPITAL_BASED_OUTPATIENT_CLINIC_OR_DEPARTMENT_OTHER)
Admission: EM | Admit: 2016-02-03 | Discharge: 2016-02-03 | Disposition: A | Discharge status: Home / Self Care | Payer: BLUE CROSS/BLUE SHIELD | Attending: Emergency Medicine | Admitting: Emergency Medicine

## 2016-02-03 DIAGNOSIS — L03011 Cellulitis of right finger: Secondary | ICD-10-CM | POA: Diagnosis not present

## 2016-02-03 DIAGNOSIS — Z79899 Other long term (current) drug therapy: Secondary | ICD-10-CM | POA: Diagnosis not present

## 2016-02-03 DIAGNOSIS — M79644 Pain in right finger(s): Secondary | ICD-10-CM | POA: Diagnosis present

## 2016-02-03 DIAGNOSIS — M7989 Other specified soft tissue disorders: Secondary | ICD-10-CM | POA: Diagnosis not present

## 2016-02-03 DIAGNOSIS — Z7984 Long term (current) use of oral hypoglycemic drugs: Secondary | ICD-10-CM | POA: Diagnosis not present

## 2016-02-03 DIAGNOSIS — F17210 Nicotine dependence, cigarettes, uncomplicated: Secondary | ICD-10-CM | POA: Diagnosis not present

## 2016-02-03 DIAGNOSIS — E119 Type 2 diabetes mellitus without complications: Secondary | ICD-10-CM | POA: Insufficient documentation

## 2016-02-03 MED ORDER — SULFAMETHOXAZOLE-TRIMETHOPRIM 800-160 MG PO TABS: 1.0000 | ORAL_TABLET | Freq: Two times a day (BID) | ORAL | 0 refills | Status: AC

## 2016-02-03 NOTE — ED Triage Notes (Addendum)
Crush injury to his right 5th finger. Swelling, redness and pain. The site looks like a paronychia.

## 2016-02-03 NOTE — Discharge Instructions (Signed)
Return if any problems.  Soak finger 20 minutes 4 times a day

## 2016-02-03 NOTE — ED Provider Notes (Signed)
[place] DEPT [facility] Provider Note   CSN: [id number] Arrival date & time: 02/03/16  1801  By signing my name below, I, Dora Sims, attest that this documentation has been prepared under the direction and in the presence of Alyse Low, [place]. Electronically Signed: Dora Sims, Scribe. 02/03/2016. 6:39 PM.  History   Chief Complaint Chief Complaint  Patient presents with  . Hand Injury    The history is provided by the patient. No language interpreter was used.     HPI Comments: SHIVANK BENCE is a 40 y.o. male who presents to the Emergency Department complaining of sudden onset, constant, right 5th finger pain s/p injury occurring on 01/29/16. Pt reports he was handling 75-100 lb objects at work and smashed the top of his right 5th finger with one of them. He notes associated swelling. Pt denies drainage, numbness, or any other associated symptoms.  Past Medical History:  Diagnosis Date  . Back pain   . Diabetes mellitus   . 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 Non compliance w medication regimen     Patient Active Problem List   Diagnosis Date Noted  . Substance abuse 01/15/2016  . Acute respiratory failure with hypoxia ([place]) 01/15/2016  . Lactic acidosis 01/15/2016  . Obtundation 01/14/2016  . Acute encephalopathy 01/14/2016  . Essential hypertension, benign 12/09/2014  . Tenosynovitis of right foot 08/12/2014  . Pain in lower limb 08/12/2014    Past Surgical History:  Procedure Laterality Date  . brain tumor removal    . INNER EAR SURGERY         Home Medications    Prior to Admission medications   Medication Sig Start Date End Date Taking? Authorizing Provider  acetaminophen (TYLENOL) 500 MG tablet Take 1,000 mg by mouth once as needed. For pain    Historical Provider, MD  atorvastatin (LIPITOR) 40 MG tablet Take 1 tablet (40 mg total) by mouth daily at 6 PM. 01/16/16   Debbe Odea, MD  ibuprofen (ADVIL,MOTRIN) 200 MG tablet Take 2 tablets (400  mg total) by mouth every 8 (eight) hours as needed. For pain 01/16/16   Debbe Odea, MD  lisinopril (PRINIVIL,ZESTRIL) 10 MG tablet Take 1 tablet (10 mg total) by mouth daily. 01/16/16   Debbe Odea, MD  metFORMIN (GLUCOPHAGE-XR) 500 MG 24 hr tablet Take 1 tablet (500 mg total) by mouth daily with supper. 01/16/16   Debbe Odea, MD  RANITIDINE HCL PO Take 1 tablet by mouth daily as needed (indigestion).    Historical Provider, MD    Family History Family History  Problem Relation Age of Onset  . Diabetes Mother   . Diabetes Father     Social History Social History  Substance Use Topics  . Smoking status: Current Some Day Smoker    Packs/day: 0.50    Types: Cigarettes  . Smokeless tobacco: Never Used  . Alcohol use 0.0 oz/week     Comment: socially     Allergies   Review of patient's allergies indicates no known allergies.   Review of Systems Review of Systems  Musculoskeletal: Positive for arthralgias (right 5th finger) and joint swelling (right 5th finger).  Neurological: Negative for numbness.  All other systems reviewed and are negative.   Physical Exam Updated Vital Signs BP 119/79   Pulse 73   Temp 98.5 F (36.9 C) (Oral)   Resp 14   Ht 5\' 8"  (1.727 m)   Wt 155 lb (70.3 kg)   SpO2 95%  BMI 23.57 kg/m   Physical Exam  Constitutional: He is oriented to person, place, and time. He appears well-developed and well-nourished. No distress.  HENT:  Head: Normocephalic and atraumatic.  Eyes: Conjunctivae and EOM are normal.  Neck: Neck supple. No tracheal deviation present.  Cardiovascular: Normal rate.   Pulmonary/Chest: Effort normal. No respiratory distress.  Musculoskeletal: Normal range of motion.  Neurological: He is alert and oriented to person, place, and time.  Skin: Skin is warm and dry.  Redness and swelling to the right outer 5th finger cuticle area.   Psychiatric: He has a normal mood and affect. His behavior is normal.  Nursing note and vitals  reviewed.   ED Treatments / Results  Labs (all labs ordered are listed, but only abnormal results are displayed) Labs Reviewed - No data to display  EKG  EKG Interpretation None       Radiology Dg Finger Little Right  Result Date: 02/03/2016 CLINICAL DATA:  Work related injury distally in the right small finger with crushing. EXAM: RIGHT LITTLE FINGER 2+V COMPARISON:  None. FINDINGS: Medial soft tissue swelling distally in the small finger along with some mild dorsal soft tissue swelling. No visible foreign body or acute fracture. IMPRESSION: 1. Soft tissue swelling distally in the small finger without visible fracture, malalignment, or foreign body. Electronically Signed   By: Van Clines M.D.   On: 02/03/2016 18:28    Procedures .Marland KitchenIncision and Drainage Date/Time: 02/03/2016 6:49 PM Performed by: Fransico Meadow Authorized by: Fransico Meadow   Consent:    Consent obtained:  Verbal   Consent given by:  Patient Universal protocol:    Procedure explained and questions answered to patient or proxy's satisfaction: yes   Location:    Type:  Abscess   Location:  Upper extremity   Upper extremity location:  Finger   Finger location:  R small finger Anesthesia (see MAR for exact dosages):    Anesthesia method:  None Procedure type:    Complexity:  Simple Procedure details:    Incision types:  Stab incision   Scalpel size: 18.   Drainage:  Purulent   Drainage amount:  Moderate Post-procedure details:    Patient tolerance of procedure:  Tolerated well, no immediate complications Comments:     Sterile prep, no drape, 18 gauge blade, moderate amount of green purulent drainage.   (including critical care time)  DIAGNOSTIC STUDIES: Oxygen Saturation is 95% on RA, normal by my interpretation.    COORDINATION OF CARE: 6:45 PM Discussed treatment plan with pt at bedside and pt agreed to plan.  Medications Ordered in ED Medications - No data to display   Initial  Impression / Assessment and Plan / ED Course  I have reviewed the triage vital signs and the nursing notes.  Pertinent labs & imaging results that were available during my care of the patient were reviewed by me and considered in my medical decision making (see chart for details).  Clinical Course    Sterile prep, no drap, 18 gauge, moderate amount of green purulent drainage   Final Clinical Impressions(s) / ED Diagnoses   Final diagnoses:  Paronychia of right little finger  Paronychia, finger, right    New Prescriptions New Prescriptions   No medications on file   No outpatient prescriptions have been marked as taking for the 02/03/16 encounter [facility] Encounter).     Hollace Kinnier [place], PA-C 02/03/16 2045    Veryl Speak, MD 02/03/16 2133

## 2017-07-22 ENCOUNTER — Encounter: Payer: Self-pay | Admitting: Emergency Medicine

## 2017-07-22 ENCOUNTER — Other Ambulatory Visit: Payer: Self-pay

## 2017-07-22 ENCOUNTER — Ambulatory Visit (INDEPENDENT_AMBULATORY_CARE_PROVIDER_SITE_OTHER): Payer: BLUE CROSS/BLUE SHIELD | Admitting: Emergency Medicine

## 2017-07-22 VITALS — BP 131/89 | HR 100 | Temp 98.4°F | Resp 16 | Ht 67.0 in | Wt 140.4 lb

## 2017-07-22 DIAGNOSIS — S39012A Strain of muscle, fascia and tendon of lower back, initial encounter: Secondary | ICD-10-CM

## 2017-07-22 DIAGNOSIS — E1165 Type 2 diabetes mellitus with hyperglycemia: Secondary | ICD-10-CM

## 2017-07-22 DIAGNOSIS — Z8639 Personal history of other endocrine, nutritional and metabolic disease: Secondary | ICD-10-CM

## 2017-07-22 DIAGNOSIS — R739 Hyperglycemia, unspecified: Secondary | ICD-10-CM | POA: Insufficient documentation

## 2017-07-22 DIAGNOSIS — M545 Low back pain, unspecified: Secondary | ICD-10-CM | POA: Insufficient documentation

## 2017-07-22 DIAGNOSIS — E119 Type 2 diabetes mellitus without complications: Secondary | ICD-10-CM | POA: Insufficient documentation

## 2017-07-22 LAB — POCT URINALYSIS DIP (MANUAL ENTRY)
BILIRUBIN UA: NEGATIVE mg/dL
Bilirubin, UA: NEGATIVE
Blood, UA: NEGATIVE
Glucose, UA: 500 mg/dL — AB
LEUKOCYTES UA: NEGATIVE
NITRITE UA: NEGATIVE
PH UA: 6 (ref 5.0–8.0)
Protein Ur, POC: NEGATIVE mg/dL
Spec Grav, UA: 1.015 (ref 1.010–1.025)
UROBILINOGEN UA: 0.2 U/dL

## 2017-07-22 LAB — GLUCOSE, POCT (MANUAL RESULT ENTRY): POC Glucose: 265 mg/dl — AB (ref 70–99)

## 2017-07-22 LAB — POCT GLYCOSYLATED HEMOGLOBIN (HGB A1C): HEMOGLOBIN A1C: 10.1

## 2017-07-22 MED ORDER — CYCLOBENZAPRINE HCL 10 MG PO TABS: 10.0000 mg | ORAL_TABLET | Freq: Every day | ORAL | 0 refills | Status: DC

## 2017-07-22 MED ORDER — KETOROLAC TROMETHAMINE 60 MG/2ML IM SOLN
60.0000 mg | Freq: Once | INTRAMUSCULAR | Status: AC
  Administered 2017-07-22: 60 mg via INTRAMUSCULAR

## 2017-07-22 MED ORDER — DICLOFENAC SODIUM 75 MG PO TBEC: 75.0000 mg | DELAYED_RELEASE_TABLET | Freq: Two times a day (BID) | ORAL | 0 refills | Status: AC

## 2017-07-22 MED ORDER — METFORMIN HCL 1000 MG PO TABS: 1000.0000 mg | ORAL_TABLET | Freq: Two times a day (BID) | ORAL | 3 refills | Status: DC

## 2017-07-22 NOTE — Progress Notes (Signed)
Tyler Green 42 y.o.   Chief Complaint  Patient presents with  . Back Pain    lower x 2 days    HISTORY OF PRESENT ILLNESS: This is a 42 y.o. male complaining of 2 things: #1 lower back pain for 2 days.  Has a history of on and off chronic low back pain.  Denies fever or chills.  Denies nausea or vomiting.  No flank pain.  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  Sharp pain made worse by movement.  No neurological symptoms.  No associated symptoms. #2 history of diabetes on no medications.  Glucose may be very high.  Still eating and drinking well.  No nausea or vomiting.  Was on metformin at some point.  Was told that he was going to need insulin.  No recent medical follow-up. #3 rash to right calf.  HPI   Prior to Admission medications   Medication Sig Start Date End Date Taking? Authorizing Provider  acetaminophen (TYLENOL) 500 MG tablet Take 1,000 mg by mouth once as needed. For pain   Yes [provider]  ibuprofen (ADVIL,MOTRIN) 200 MG tablet Take 2 tablets (400 mg total) by mouth every 8 (eight) hours as needed. For pain 01/16/16  Yes Rizwan, Eunice Blase, MD  RANITIDINE HCL PO Take 1 tablet by mouth daily as needed (indigestion).   Yes [provider]  atorvastatin (LIPITOR) 40 MG tablet Take 1 tablet (40 mg total) by mouth daily at 6 PM. Patient not taking: Reported on 07/22/2017 01/16/16   Debbe Odea, MD  lisinopril (PRINIVIL,ZESTRIL) 10 MG tablet Take 1 tablet (10 mg total) by mouth daily. Patient not taking: Reported on 07/22/2017 01/16/16   Debbe Odea, MD  metFORMIN (GLUCOPHAGE-XR) 500 MG 24 hr tablet Take 1 tablet (500 mg total) by mouth daily with supper. Patient not taking: Reported on 07/22/2017 01/16/16   Debbe Odea, MD    No Known Allergies  Patient Active Problem List   Diagnosis Date Noted  . Substance abuse ([place]) 01/15/2016  . Acute respiratory failure with hypoxia ([place]) 01/15/2016  . Lactic acidosis 01/15/2016    . Obtundation 01/14/2016  . Acute encephalopathy 01/14/2016  . Essential hypertension, benign 12/09/2014  . Tenosynovitis of right foot 08/12/2014  . Pain in lower limb 08/12/2014    Past Medical History:  Diagnosis Date  . Back pain   . Diabetes mellitus   . 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 Non compliance w medication regimen     Past Surgical History:  Procedure Laterality Date  . brain tumor removal    . INNER EAR SURGERY      Social History   Socioeconomic History  . Marital status: Single    Spouse name: Not on file  . Number of children: Not on file  . Years of education: Not on file  . Highest education level: Not on file  Occupational History  . Not on file  Social Needs  . Financial resource strain: Not on file  . Food insecurity:    Worry: Not on file    Inability: Not on file  . Transportation needs:    Medical: Not on file    Non-medical: Not on file  Tobacco Use  . Smoking status: Current Some Day Smoker    Packs/day: 0.50    Types: Cigarettes  . Smokeless tobacco: Never Used  Substance and Sexual Activity  . Alcohol use: Yes    Alcohol/week: 0.0 oz  Comment: socially  . Drug use: No  . Sexual activity: Not on file  Lifestyle  . Physical activity:    Days per week: Not on file    Minutes per session: Not on file  . Stress: Not on file  Relationships  . Social connections:    Talks on phone: Not on file    Gets together: Not on file    Attends religious service: Not on file    Active member of club or organization: Not on file    Attends meetings of clubs or organizations: Not on file    Relationship status: Not on file  . Intimate partner violence:    Fear of current or ex partner: Not on file    Emotionally abused: Not on file    Physically abused: Not on file    Forced sexual activity: Not on file  Other Topics Concern  . Not on file  Social History Narrative  . Not on file    Family History  Problem Relation Age of  Onset  . Diabetes Mother   . Diabetes Father      Review of Systems  Constitutional: Negative.  Negative for chills and fever.  HENT: Negative.  Negative for sore throat.   Eyes: Negative.  Negative for blurred vision and double vision.  Respiratory: Negative.  Negative for cough, shortness of breath and wheezing.   Cardiovascular: Negative.  Negative for chest pain and palpitations.  Gastrointestinal: Negative.  Negative for abdominal pain, diarrhea, nausea and vomiting.  Genitourinary: Negative.  Negative for dysuria and hematuria.  Musculoskeletal: Positive for back pain (Lumbar).  Skin: Positive for itching and rash (Right calf).  Neurological: Negative.  Negative for dizziness, sensory change, focal weakness and headaches.  Endo/Heme/Allergies: Negative.   All other systems reviewed and are negative.    Physical Exam  Constitutional: He is oriented to person, place, and time. He appears well-developed and well-nourished.  HENT:  Head: Normocephalic and atraumatic.  Right Ear: External ear normal.  Left Ear: External ear normal.  Mouth/Throat: Oropharynx is clear and moist.  Eyes: Pupils are equal, round, and reactive to light. Conjunctivae and EOM are normal.  Neck: Normal range of motion. Neck supple. No JVD present. No thyromegaly present.  Cardiovascular: Normal rate, regular rhythm, normal heart sounds and intact distal pulses.  Pulmonary/Chest: Effort normal and breath sounds normal. No respiratory distress.  Abdominal: Soft. Bowel sounds are normal. He exhibits no distension. There is no tenderness.  Musculoskeletal:       Lumbar back: He exhibits decreased range of motion, tenderness, pain and spasm. He exhibits no bony tenderness and normal pulse.  Lymphadenopathy:    He has no cervical adenopathy.  Neurological: He is alert and oriented to person, place, and time. He displays normal reflexes. No cranial nerve deficit or sensory deficit. He exhibits normal muscle  tone. Coordination normal.  Skin: Skin is warm and dry. Capillary refill takes less than 2 seconds.  Ringworm type lesion to right calf.  Psychiatric: He has a normal mood and affect. His behavior is normal.  Vitals reviewed.  A total of 40 minutes was spent in the room with the patient, greater than 50% of which was in counseling/coordination of care. Results for orders placed or performed in visit on 07/22/17 (from the past 24 hour(s))  POCT glucose (manual entry)     Status: Abnormal   Collection Time: 07/22/17  4:04 PM  Result Value Ref Range   POC Glucose 265 (A)  70 - 99 mg/dl  POCT urinalysis dipstick     Status: Abnormal   Collection Time: 07/22/17  4:04 PM  Result Value Ref Range   Color, UA yellow yellow   Clarity, UA clear clear   Glucose, UA =500 (A) negative mg/dL   Bilirubin, UA negative negative   Ketones, POC UA negative negative mg/dL   Spec Grav, UA 1.015 1.010 - 1.025   Blood, UA negative negative   pH, UA 6.0 5.0 - 8.0   Protein Ur, POC negative negative mg/dL   Urobilinogen, UA 0.2 0.2 or 1.0 E.U./dL   Nitrite, UA Negative Negative   Leukocytes, UA Negative Negative  POCT glycosylated hemoglobin (Hb A1C)     Status: Abnormal   Collection Time: 07/22/17  4:22 PM  Result Value Ref Range   Hemoglobin A1C 10.1    Lumbar pain Musculoskeletal pain.  Will treat with NSAIDs and muscle relaxants.  Most likely related to work duties.  Conservative treatment.  Hyperglycemia Patient is a type II diabetic.  Presently on no medications.  Stable.  Hyperglycemic with elevated A1c.  No ketones in the urine.  No apparent complications.  Will restart metformin at a higher dose 1000 mg twice a day.  Encouraged to follow diabetic diet.  Follow-up in 4 weeks.    ASSESSMENT & PLAN: Alam was seen today for back pain.  Diagnoses and all orders for this visit:  Acute myofascial strain of lumbar region, initial encounter  Lumbar pain -     ketorolac (TORADOL) injection 60  mg -     diclofenac (VOLTAREN) 75 MG EC tablet; Take 1 tablet (75 mg total) by mouth 2 (two) times daily for 5 days. -     cyclobenzaprine (FLEXERIL) 10 MG tablet; Take 1 tablet (10 mg total) by mouth at bedtime.  Hyperglycemia  Type 2 diabetes mellitus with hyperglycemia, without long-term current use of insulin (HCC)  History of diabetes mellitus -     POCT glucose (manual entry) -     POCT glycosylated hemoglobin (Hb A1C) -     Comprehensive metabolic panel -     POCT urinalysis dipstick -     metFORMIN (GLUCOPHAGE) 1000 MG tablet; Take 1 tablet (1,000 mg total) by mouth 2 (two) times daily with a meal.   Follow up in 4 weeks.   Patient Instructions       IF you received an x-ray today, you will receive an invoice from [facility]. Please contact [facility] at [phone] with questions or concerns regarding your invoice.   IF you received labwork today, you will receive an invoice from [place]. Please contact [facility] at [phone] with questions or concerns regarding your invoice.   Our billing staff will not be able to assist you with questions regarding bills from these companies.  You will be contacted with the lab results as soon as they are available. The fastest way to get your results is to activate your My Chart account. Instructions are located on the last page of this paperwork. If you have not heard from [place] regarding the results in 2 weeks, please contact this office.     Diabetes Mellitus and Nutrition When you have diabetes (diabetes mellitus), it is very important to have healthy eating habits because your blood sugar (glucose) levels are greatly affected by what you eat and drink. Eating healthy foods in the appropriate amounts, at about the same times every day, can help you:  Control your blood glucose.  Lower  your risk of heart disease.  Improve your blood pressure.  Reach or maintain a healthy weight.  Every person with  diabetes is different, and each person has different needs for a meal plan. Your health care provider may recommend that you work with a diet and nutrition specialist (dietitian) to make a meal plan that is best for you. Your meal plan may vary depending on factors such as:  The calories you need.  The medicines you take.  Your weight.  Your blood glucose, blood pressure, and cholesterol levels.  Your activity level.  Other health conditions you have, such as heart or kidney disease.  How do carbohydrates affect me? Carbohydrates affect your blood glucose level more than any other type of food. Eating carbohydrates naturally increases the amount of glucose in your blood. Carbohydrate counting is a method for keeping track of how many carbohydrates you eat. Counting carbohydrates is important to keep your blood glucose at a healthy level, especially if you use insulin or take certain oral diabetes medicines. It is important to know how many carbohydrates you can safely have in each meal. This is different for every person. Your dietitian can help you calculate how many carbohydrates you should have at each meal and for snack. Foods that contain carbohydrates include:  Bread, cereal, rice, pasta, and crackers.  Potatoes and corn.  Peas, beans, and lentils.  Milk and yogurt.  Fruit and juice.  Desserts, such as cakes, cookies, ice cream, and candy.  How does alcohol affect me? Alcohol can cause a sudden decrease in blood glucose (hypoglycemia), especially if you use insulin or take certain oral diabetes medicines. Hypoglycemia can be a life-threatening condition. Symptoms of hypoglycemia (sleepiness, dizziness, and confusion) are similar to symptoms of having too much alcohol. If your health care provider says that alcohol is safe for you, follow these guidelines:  Limit alcohol intake to no more than 1 drink per day for nonpregnant women and 2 drinks per day for men. One drink equals  12 oz of beer, 5 oz of wine, or 1 oz of hard liquor.  Do not drink on an empty stomach.  Keep yourself hydrated with water, diet soda, or unsweetened iced tea.  Keep in mind that regular soda, juice, and other mixers may contain a lot of sugar and must be counted as carbohydrates.  What are tips for following this plan? Reading food labels  Start by checking the serving size on the label. The amount of calories, carbohydrates, fats, and other nutrients listed on the label are based on one serving of the food. Many foods contain more than one serving per package.  Check the total grams (g) of carbohydrates in one serving. You can calculate the number of servings of carbohydrates in one serving by dividing the total carbohydrates by 15. For example, if a food has 30 g of total carbohydrates, it would be equal to 2 servings of carbohydrates.  Check the number of grams (g) of saturated and trans fats in one serving. Choose foods that have low or no amount of these fats.  Check the number of milligrams (mg) of sodium in one serving. Most people should limit total sodium intake to less than 2,300 mg per day.  Always check the nutrition information of foods labeled as "low-fat" or "nonfat". These foods may be higher in added sugar or refined carbohydrates and should be avoided.  Talk to your dietitian to identify your daily goals for nutrients listed on the label. Shopping  Avoid buying canned, premade, or processed foods. These foods tend to be high in fat, sodium, and added sugar.  Shop around the outside edge of the grocery store. This includes fresh fruits and vegetables, bulk grains, fresh meats, and fresh dairy. Cooking  Use low-heat cooking methods, such as baking, instead of high-heat cooking methods like deep frying.  Cook using healthy oils, such as olive, canola, or sunflower oil.  Avoid cooking with butter, cream, or high-fat meats. Meal planning  Eat meals and snacks  regularly, preferably at the same times every day. Avoid going long periods of time without eating.  Eat foods high in fiber, such as fresh fruits, vegetables, beans, and whole grains. Talk to your dietitian about how many servings of carbohydrates you can eat at each meal.  Eat 4-6 ounces of lean protein each day, such as lean meat, chicken, fish, eggs, or tofu. 1 ounce is equal to 1 ounce of meat, chicken, or fish, 1 egg, or 1/4 cup of tofu.  Eat some foods each day that contain healthy fats, such as avocado, nuts, seeds, and fish. Lifestyle   Check your blood glucose regularly.  Exercise at least 30 minutes 5 or more days each week, or as told by your health care provider.  Take medicines as told by your health care provider.  Do not use any products that contain nicotine or tobacco, such as cigarettes and e-cigarettes. If you need help quitting, ask your health care provider.  Work with a Social worker or diabetes educator to identify strategies to manage stress and any emotional and social challenges. What are some questions to ask my health care provider?  Do I need to meet with a diabetes educator?  Do I need to meet with a dietitian?  What number can I call if I have questions?  When are the best times to check my blood glucose? Where to find more information:  American Diabetes Association: diabetes.org/food-and-fitness/food  Academy of Nutrition and Dietetics: PokerClues.dk  [employer] of Diabetes and Digestive and Kidney Diseases (NIH): ContactWire.be Summary  A healthy meal plan will help you control your blood glucose and maintain a healthy lifestyle.  Working with a diet and nutrition specialist (dietitian) can help you make a meal plan that is best for you.  Keep in mind that carbohydrates and alcohol have immediate effects on your  blood glucose levels. It is important to count carbohydrates and to use alcohol carefully. This information is not intended to replace advice given to you by your health care provider. Make sure you discuss any questions you have with your health care provider. Document Released: 01/07/2005 Document Revised: 05/17/2016 Document Reviewed: 05/17/2016 Elsevier Interactive Patient Education  2018 [employer].    Agustina Caroli, MD [facility]

## 2017-07-22 NOTE — Assessment & Plan Note (Signed)
Patient is a type II diabetic.  Presently on no medications.  Stable.  Hyperglycemic with elevated A1c.  No ketones in the urine.  No apparent complications.  Will restart metformin at a higher dose 1000 mg twice a day.  Encouraged to follow diabetic diet.  Follow-up in 4 weeks.

## 2017-07-22 NOTE — Assessment & Plan Note (Signed)
Musculoskeletal pain.  Will treat with NSAIDs and muscle relaxants.  Most likely related to work duties.  Conservative treatment.

## 2017-07-22 NOTE — Patient Instructions (Addendum)
IF you received an x-ray today, you will receive an invoice from [facility]. Please contact [facility] at [phone] with questions or concerns regarding your invoice.   IF you received labwork today, you will receive an invoice from [place]. Please contact [facility] at [phone] with questions or concerns regarding your invoice.   Our billing staff will not be able to assist you with questions regarding bills from these companies.  You will be contacted with the lab results as soon as they are available. The fastest way to get your results is to activate your My Chart account. Instructions are located on the last page of this paperwork. If you have not heard from [place] regarding the results in 2 weeks, please contact this office.     Diabetes Mellitus and Nutrition When you have diabetes (diabetes mellitus), it is very important to have healthy eating habits because your blood sugar (glucose) levels are greatly affected by what you eat and drink. Eating healthy foods in the appropriate amounts, at about the same times every day, can help you:  Control your blood glucose.  Lower your risk of heart disease.  Improve your blood pressure.  Reach or maintain a healthy weight.  Every person with diabetes is different, and each person has different needs for a meal plan. Your health care provider may recommend that you work with a diet and nutrition specialist (dietitian) to make a meal plan that is best for you. Your meal plan may vary depending on factors such as:  The calories you need.  The medicines you take.  Your weight.  Your blood glucose, blood pressure, and cholesterol levels.  Your activity level.  Other health conditions you have, such as heart or kidney disease.  How do carbohydrates affect me? Carbohydrates affect your blood glucose level more than any other type of food. Eating carbohydrates naturally increases the amount of glucose in your  blood. Carbohydrate counting is a method for keeping track of how many carbohydrates you eat. Counting carbohydrates is important to keep your blood glucose at a healthy level, especially if you use insulin or take certain oral diabetes medicines. It is important to know how many carbohydrates you can safely have in each meal. This is different for every person. Your dietitian can help you calculate how many carbohydrates you should have at each meal and for snack. Foods that contain carbohydrates include:  Bread, cereal, rice, pasta, and crackers.  Potatoes and corn.  Peas, beans, and lentils.  Milk and yogurt.  Fruit and juice.  Desserts, such as cakes, cookies, ice cream, and candy.  How does alcohol affect me? Alcohol can cause a sudden decrease in blood glucose (hypoglycemia), especially if you use insulin or take certain oral diabetes medicines. Hypoglycemia can be a life-threatening condition. Symptoms of hypoglycemia (sleepiness, dizziness, and confusion) are similar to symptoms of having too much alcohol. If your health care provider says that alcohol is safe for you, follow these guidelines:  Limit alcohol intake to no more than 1 drink per day for nonpregnant women and 2 drinks per day for men. One drink equals 12 oz of beer, 5 oz of wine, or 1 oz of hard liquor.  Do not drink on an empty stomach.  Keep yourself hydrated with water, diet soda, or unsweetened iced tea.  Keep in mind that regular soda, juice, and other mixers may contain a lot of sugar and must be counted as carbohydrates.  What are tips for following  this plan? Reading food labels  Start by checking the serving size on the label. The amount of calories, carbohydrates, fats, and other nutrients listed on the label are based on one serving of the food. Many foods contain more than one serving per package.  Check the total grams (g) of carbohydrates in one serving. You can calculate the number of servings of  carbohydrates in one serving by dividing the total carbohydrates by 15. For example, if a food has 30 g of total carbohydrates, it would be equal to 2 servings of carbohydrates.  Check the number of grams (g) of saturated and trans fats in one serving. Choose foods that have low or no amount of these fats.  Check the number of milligrams (mg) of sodium in one serving. Most people should limit total sodium intake to less than 2,300 mg per day.  Always check the nutrition information of foods labeled as "low-fat" or "nonfat". These foods may be higher in added sugar or refined carbohydrates and should be avoided.  Talk to your dietitian to identify your daily goals for nutrients listed on the label. Shopping  Avoid buying canned, premade, or processed foods. These foods tend to be high in fat, sodium, and added sugar.  Shop around the outside edge of the grocery store. This includes fresh fruits and vegetables, bulk grains, fresh meats, and fresh dairy. Cooking  Use low-heat cooking methods, such as baking, instead of high-heat cooking methods like deep frying.  Cook using healthy oils, such as olive, canola, or sunflower oil.  Avoid cooking with butter, cream, or high-fat meats. Meal planning  Eat meals and snacks regularly, preferably at the same times every day. Avoid going long periods of time without eating.  Eat foods high in fiber, such as fresh fruits, vegetables, beans, and whole grains. Talk to your dietitian about how many servings of carbohydrates you can eat at each meal.  Eat 4-6 ounces of lean protein each day, such as lean meat, chicken, fish, eggs, or tofu. 1 ounce is equal to 1 ounce of meat, chicken, or fish, 1 egg, or 1/4 cup of tofu.  Eat some foods each day that contain healthy fats, such as avocado, nuts, seeds, and fish. Lifestyle   Check your blood glucose regularly.  Exercise at least 30 minutes 5 or more days each week, or as told by your health care  provider.  Take medicines as told by your health care provider.  Do not use any products that contain nicotine or tobacco, such as cigarettes and e-cigarettes. If you need help quitting, ask your health care provider.  Work with a Social worker or diabetes educator to identify strategies to manage stress and any emotional and social challenges. What are some questions to ask my health care provider?  Do I need to meet with a diabetes educator?  Do I need to meet with a dietitian?  What number can I call if I have questions?  When are the best times to check my blood glucose? Where to find more information:  American Diabetes Association: diabetes.org/food-and-fitness/food  Academy of Nutrition and Dietetics: PokerClues.dk  [employer] of Diabetes and Digestive and Kidney Diseases (NIH): ContactWire.be Summary  A healthy meal plan will help you control your blood glucose and maintain a healthy lifestyle.  Working with a diet and nutrition specialist (dietitian) can help you make a meal plan that is best for you.  Keep in mind that carbohydrates and alcohol have immediate effects on your blood glucose  levels. It is important to count carbohydrates and to use alcohol carefully. This information is not intended to replace advice given to you by your health care provider. Make sure you discuss any questions you have with your health care provider. Document Released: 01/07/2005 Document Revised: 05/17/2016 Document Reviewed: 05/17/2016 Elsevier Interactive Patient Education  [employer].

## 2017-07-23 LAB — COMPREHENSIVE METABOLIC PANEL
ALBUMIN: 4 g/dL (ref 3.5–5.5)
ALT: 14 IU/L (ref 0–44)
AST: 8 IU/L (ref 0–40)
Albumin/Globulin Ratio: 1.8 (ref 1.2–2.2)
Alkaline Phosphatase: 63 IU/L (ref 39–117)
BUN / CREAT RATIO: 14 (ref 9–20)
BUN: 9 mg/dL (ref 6–24)
Bilirubin Total: 0.9 mg/dL (ref 0.0–1.2)
CALCIUM: 9.5 mg/dL (ref 8.7–10.2)
CO2: 27 mmol/L (ref 20–29)
CREATININE: 0.66 mg/dL — AB (ref 0.76–1.27)
Chloride: 100 mmol/L (ref 96–106)
GFR calc non Af Amer: 120 mL/min/{1.73_m2} (ref >59)
GFR, EST AFRICAN AMERICAN: 139 mL/min/{1.73_m2} (ref >59)
GLUCOSE: 281 mg/dL — AB (ref 65–99)
Globulin, Total: 2.2 g/dL (ref 1.5–4.5)
Potassium: 3.8 mmol/L (ref 3.5–5.2)
Sodium: 137 mmol/L (ref 134–144)
TOTAL PROTEIN: 6.2 g/dL (ref 6.0–8.5)

## 2017-07-26 ENCOUNTER — Encounter: Payer: Self-pay | Admitting: *Deleted

## 2017-08-07 ENCOUNTER — Emergency Department (HOSPITAL_COMMUNITY): Payer: BLUE CROSS/BLUE SHIELD

## 2017-08-07 ENCOUNTER — Emergency Department (HOSPITAL_COMMUNITY)
Admission: EM | Admit: 2017-08-07 | Discharge: 2017-08-07 | Disposition: A | Discharge status: Home / Self Care | Payer: BLUE CROSS/BLUE SHIELD | Attending: Physician Assistant | Admitting: Physician Assistant

## 2017-08-07 ENCOUNTER — Encounter (HOSPITAL_COMMUNITY): Payer: Self-pay | Admitting: *Deleted

## 2017-08-07 ENCOUNTER — Other Ambulatory Visit: Payer: Self-pay

## 2017-08-07 DIAGNOSIS — R000 Tachycardia, unspecified: Secondary | ICD-10-CM | POA: Diagnosis not present

## 2017-08-07 DIAGNOSIS — I10 Essential (primary) hypertension: Secondary | ICD-10-CM | POA: Diagnosis not present

## 2017-08-07 DIAGNOSIS — T50901A Poisoning by unspecified drugs, medicaments and biological substances, accidental (unintentional), initial encounter: Secondary | ICD-10-CM

## 2017-08-07 DIAGNOSIS — T50904A Poisoning by unspecified drugs, medicaments and biological substances, undetermined, initial encounter: Secondary | ICD-10-CM | POA: Diagnosis not present

## 2017-08-07 DIAGNOSIS — Z7984 Long term (current) use of oral hypoglycemic drugs: Secondary | ICD-10-CM | POA: Insufficient documentation

## 2017-08-07 DIAGNOSIS — T401X1A Poisoning by heroin, accidental (unintentional), initial encounter: Secondary | ICD-10-CM | POA: Insufficient documentation

## 2017-08-07 DIAGNOSIS — I469 Cardiac arrest, cause unspecified: Secondary | ICD-10-CM | POA: Diagnosis not present

## 2017-08-07 DIAGNOSIS — Z79899 Other long term (current) drug therapy: Secondary | ICD-10-CM | POA: Insufficient documentation

## 2017-08-07 DIAGNOSIS — E119 Type 2 diabetes mellitus without complications: Secondary | ICD-10-CM | POA: Diagnosis not present

## 2017-08-07 DIAGNOSIS — F17210 Nicotine dependence, cigarettes, uncomplicated: Secondary | ICD-10-CM | POA: Insufficient documentation

## 2017-08-07 LAB — CBC WITH DIFFERENTIAL/PLATELET
BASOS ABS: 0 10*3/uL (ref 0.0–0.1)
Basophils Relative: 0 %
Eosinophils Absolute: 0 10*3/uL (ref 0.0–0.7)
Eosinophils Relative: 0 %
HEMATOCRIT: 43.3 % (ref 39.0–52.0)
Hemoglobin: 15.6 g/dL (ref 13.0–17.0)
LYMPHS ABS: 1.1 10*3/uL (ref 0.7–4.0)
LYMPHS PCT: 9 %
MCH: 32.6 pg (ref 26.0–34.0)
MCHC: 36 g/dL (ref 30.0–36.0)
MCV: 90.6 fL (ref 78.0–100.0)
Monocytes Absolute: 0.9 10*3/uL (ref 0.1–1.0)
Monocytes Relative: 8 %
NEUTROS ABS: 10 10*3/uL — AB (ref 1.7–7.7)
Neutrophils Relative %: 83 %
Platelets: 257 10*3/uL (ref 150–400)
RBC: 4.78 MIL/uL (ref 4.22–5.81)
RDW: 12.5 % (ref 11.5–15.5)
WBC: 12.1 10*3/uL — AB (ref 4.0–10.5)

## 2017-08-07 LAB — COMPREHENSIVE METABOLIC PANEL
ALBUMIN: 3.8 g/dL (ref 3.5–5.0)
ALT: 18 U/L (ref 17–63)
ANION GAP: 15 (ref 5–15)
AST: 18 U/L (ref 15–41)
Alkaline Phosphatase: 54 U/L (ref 38–126)
BILIRUBIN TOTAL: 0.4 mg/dL (ref 0.3–1.2)
BUN: 9 mg/dL (ref 6–20)
CHLORIDE: 106 mmol/L (ref 101–111)
CO2: 24 mmol/L (ref 22–32)
Calcium: 8.5 mg/dL — ABNORMAL LOW (ref 8.9–10.3)
Creatinine, Ser: 0.6 mg/dL — ABNORMAL LOW (ref 0.61–1.24)
GFR calc Af Amer: >60 mL/min (ref >60)
GLUCOSE: 177 mg/dL — AB (ref 65–99)
POTASSIUM: 3.7 mmol/L (ref 3.5–5.1)
Sodium: 145 mmol/L (ref 135–145)
TOTAL PROTEIN: 6.6 g/dL (ref 6.5–8.1)

## 2017-08-07 MED ORDER — NALOXONE HCL 4 MG/0.1ML NA LIQD
1.0000 | Freq: Once | NASAL | Status: AC
  Administered 2017-08-07: 1 via NASAL
  Filled 2017-08-07: qty 4

## 2017-08-07 MED ORDER — SODIUM CHLORIDE 0.9 % IV BOLUS
1000.0000 mL | Freq: Once | INTRAVENOUS | Status: AC
  Administered 2017-08-07: 1000 mL via INTRAVENOUS

## 2017-08-07 MED ORDER — ONDANSETRON HCL 4 MG/2ML IJ SOLN
4.0000 mg | Freq: Once | INTRAMUSCULAR | Status: AC
  Administered 2017-08-07: 4 mg via INTRAVENOUS
  Filled 2017-08-07: qty 2

## 2017-08-07 MED ORDER — LORAZEPAM 2 MG/ML IJ SOLN
0.5000 mg | Freq: Once | INTRAMUSCULAR | Status: AC
  Administered 2017-08-07: 0.5 mg via INTRAVENOUS
  Filled 2017-08-07: qty 1

## 2017-08-07 NOTE — ED Triage Notes (Signed)
EMS reports, pt snorted heroin around 0600 pt was unresponsive, g/f in process of preforming CPR, pulse present. #20 IV rt arm, 1mg  Narcan given , pt woke after Narcan. This was not attempt to harm self. EKG ST CBG 208 [id number]% RM Rsp 16

## 2017-08-07 NOTE — ED Provider Notes (Signed)
[facility] DEPT Provider Note   CSN: [id number] Arrival date & time: 08/07/17  7001     History   Chief Complaint Chief Complaint  Patient presents with  . Heroin OD    HPI ISAMI MEHRA is a 42 y.o. male.  HPI   \42 year old male with past medical history significant for back pain diabetes and noncompliance.  He is presenting today with heroin overdose.  Patient was drinking with friends and did some heroin snorting.  Patient reports he does not always do this.  Patient was found passed out, blue in the face by friends.  EMS was called.  EMS started CPR but patient woke up after Narcan.  Past Medical History:  Diagnosis Date  . Back pain   . Diabetes mellitus   . 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 Non compliance w medication regimen     Patient Active Problem List   Diagnosis Date Noted  . Acute lumbar myofascial strain 07/22/2017  . Lumbar pain 07/22/2017  . Hyperglycemia 07/22/2017  . Type 2 diabetes mellitus with hyperglycemia, without long-term current use of insulin ([place]) 07/22/2017  . History of diabetes mellitus 07/22/2017  . Substance abuse ([place]) 01/15/2016  . Essential hypertension, benign 12/09/2014    Past Surgical History:  Procedure Laterality Date  . brain tumor removal    . INNER EAR SURGERY          Home Medications    Prior to Admission medications   Medication Sig Start Date End Date Taking? Authorizing Provider  acetaminophen (TYLENOL) 500 MG tablet Take 1,000 mg by mouth once as needed. For pain    [provider]  atorvastatin (LIPITOR) 40 MG tablet Take 1 tablet (40 mg total) by mouth daily at 6 PM. Patient not taking: Reported on 07/22/2017 01/16/16   Debbe Odea, MD  cyclobenzaprine (FLEXERIL) 10 MG tablet Take 1 tablet (10 mg total) by mouth at bedtime. 07/22/17   Horald Pollen, MD  ibuprofen (ADVIL,MOTRIN) 200 MG tablet Take 2 tablets (400 mg total) by mouth every 8 (eight) hours  as needed. For pain 01/16/16   Debbe Odea, MD  lisinopril (PRINIVIL,ZESTRIL) 10 MG tablet Take 1 tablet (10 mg total) by mouth daily. Patient not taking: Reported on 07/22/2017 01/16/16   Debbe Odea, MD  metFORMIN (GLUCOPHAGE) 1000 MG tablet Take 1 tablet (1,000 mg total) by mouth 2 (two) times daily with a meal. 07/22/17   Sagardia, Ines Bloomer, MD  RANITIDINE HCL PO Take 1 tablet by mouth daily as needed (indigestion).    [provider]    Family History Family History  Problem Relation Age of Onset  . Diabetes Mother   . Diabetes Father     Social History Social History   Tobacco Use  . Smoking status: Current Some Day Smoker    Packs/day: 0.50    Types: Cigarettes  . Smokeless tobacco: Never Used  Substance Use Topics  . Alcohol use: Yes    Alcohol/week: 0.0 oz    Comment: socially  . Drug use: Yes    Comment: Heroin     Allergies   Patient has no known allergies.   Review of Systems Review of Systems  Constitutional: Negative for activity change.  Respiratory: Negative for shortness of breath.   Cardiovascular: Negative for chest pain.  Gastrointestinal: Negative for abdominal pain.  All other systems reviewed and are negative.    Physical Exam Updated Vital Signs Ht 5\' 7"  (1.702  m)   Wt 63.5 kg (140 lb)   BMI 21.93 kg/m   Physical Exam  Constitutional: He is oriented to person, place, and time. He appears well-nourished.  HENT:  Head: Normocephalic.  Eyes: Conjunctivae are normal.  Pinpoint pupils.  Cardiovascular:  tachycardic  Pulmonary/Chest:  Slow respirations  Neurological: He is oriented to person, place, and time.  Patient talking, interactive with staff.  Skin: Skin is warm and dry. He is not diaphoretic.  Psychiatric: He has a normal mood and affect.     ED Treatments / Results  Labs (all labs ordered are listed, but only abnormal results are displayed) Labs Reviewed  COMPREHENSIVE METABOLIC PANEL  CBC WITH  DIFFERENTIAL/PLATELET    EKG None  Radiology No results found.  Procedures Procedures (including critical care time)  Medications Ordered in ED Medications  sodium chloride 0.9 % bolus 1,000 mL (has no administration in time range)     Initial Impression / Assessment and Plan / ED Course  I have reviewed the triage vital signs and the nursing notes.  Pertinent labs & imaging results that were available during my care of the patient were reviewed by me and considered in my medical decision making (see chart for details).      \42 year old male with past medical history significant for back pain diabetes and noncompliance.  He is presenting today with heroin overdose.  Patient was drinking with friends and did some heroin snorting.  Patient reports he does not always do this.  Patient was found passed out, blue in the face by friends.  EMS was called.  EMS started CPR but patient woke up after Narcan.  8:22 AM Will watch 4 hours.  Plan to discharge with Narcan.  12:37 PM After 4 hours patient started vomiting.  Given Zofran.  Will give Narcan to take home.  3:10 PM Patient tolerated p.o.  Patient has Narcan to go home with.  Final Clinical Impressions(s) / ED Diagnoses   Final diagnoses:  None    ED Discharge Orders    None       Jamekia Gannett, Fredia Sorrow, MD 08/07/17 1510

## 2017-08-07 NOTE — Patient Outreach (Signed)
ED Peer Support Specialist Patient Intake (Complete at intake & 30-60 Day Follow-up)  Name: Tyler Green  MRN: [id number]  Age: 42 y.o.   Date of Admission: 08/07/2017  Intake:   Comments:      Primary Reason Admitted: heroin overdose  Lab values: Alcohol/ETOH: Not completed Positive UDS? Drug screen not completed Amphetamines: Drug screen not completed Barbiturates: Drug screen not completed Benzodiazepines: Drug screen not completed Cocaine: Drug screen not completed Opiates: Drug screen not completed Cannabinoids: Drug screen not completed  Demographic information: Gender: Male Ethnicity: White Marital Status: Married Insurance underwriter Status: Private Insurance(BCBS) Ecologist (Work Neurosurgeon, Physicist, medical, etc.: No Lives with: Partner/Spouse(Wife) Living situation: House/Apartment  Reported Patient History: Patient reported health conditions: Diabetes(Diabetes Mellitus) Patient aware of HIV and hepatitis status: No  In past year, has patient visited ED for any reason? No  Number of ED visits:    Reason(s) for visit:    In past year, has patient been hospitalized for any reason? No  Number of hospitalizations:    Reason(s) for hospitalization:    In past year, has patient been arrested? No  Number of arrests:    Reason(s) for arrest:    In past year, has patient been incarcerated? No  Number of incarcerations:    Reason(s) for incarceration:    In past year, has patient received medication-assisted treatment? No  In past year, patient received the following treatments:    In past year, has patient received any harm reduction services? No  Did this include any of the following?    In past year, has patient received care from a mental health provider for diagnosis other than SUD? No  In past year, is this first time patient has overdosed? Yes  Number of past overdoses:    In past year, is this first time patient has been  hospitalized for an overdose? Yes  Number of hospitalizations for overdose(s):    Is patient currently receiving treatment for a mental health diagnosis? No  Patient reports experiencing difficulty participating in SUD treatment: No    Most important reason(s) for this difficulty?    Has patient received prior services for treatment? Yes([facility] 1999)  In past, patient has received services from following agencies:    Plan of Care:  Suggested follow up at these agencies/treatment centers: (Patient is not interested in substance use treatment at this time or help with MAT services. CPSS strongly suggested to the patient to consider getting connected with [facility] for harm reduction services.)  Other information: CPSS provided the patient with [facility] contact information and [facility] contact information. CPSS informed the patient about [facility]'s services and how to get in contact with [facility]. CPSS highly encouraged the patient to keept in contact with [facility] for substance use recovery support or help with substance use recovery resources.     Mason Jim, CPSS  08/07/2017 1:18 PM

## 2017-08-07 NOTE — Discharge Instructions (Addendum)
We recommend that you do not take heroin.  However if you do take Heroin, please have Narcan nearby as well as someone who is not taking Heroin nearby to watch you.   If you are feeling that you would like help with addiction use the followin resources  Substance Abuse Treatment Programs  Intensive Outpatient Programs [place]     [place]. [place], [place]       [facility] [address] [place] [address] [place], [place]  [facility]     (Inpatient and outpatient)     [address]           [place] [phone] (Suboxone and Methadone)  [place], [place] [address]      [place] [address] [place], [place]  [facility] (Outpatient/Inpatient, Chemical)    (insurance only) [phone]             [facility] ([place]) [place], [place]     [facility]     [address]     [place], [place]       [facility] (for caregivers and family) [phone] [address] Kristeen Mans. [place], [place]      Residential Treatment Programs [facility]      [address], [place], [place] [address]  [phone]       [facility] [address], [place], [place] [address] [phone]  [facility] [place]        [phone]       [facility] [phone]  [facility] ([place]             [place], [place] or [place] of [place], [address] [phone]  [facility]    [address]      [place], [place]       [facility] [address] [place], [place]  [place]   [address] [place], [place] [address]     [phone]      Admissions: 8am-3pm M-F  [facility] (RTS) [address] [place], [place]  [facility] Program: Residential Program [phone] Days)   [place], [place] or [phone]     [facility]: [facility] [place], [place] (Walk in Hours over  the weekend or by referral)  [facility] [place], [place], [place] [address] [phone]  Crisis Mobile: [facility]:  [phone] (for crisis response 24 hours a day) [facility] Hotline:      [phone] Outpatient Psychiatry and Counseling  [facility]: Mobile Crisis Management 24 hours:  [phone]  [facility] the [employer] sliding scale fee and walk in schedule: M-F 8am-12pm/1pm-3pm [place], [place] [address] [place], [place] [address] [phone]  [facility])- new patient walk-in appointments available Monday - Friday 8am -3pm.          [address] [place], [place] [address] [phone] or crisis line- [facility]/ Intensive Outpatient Therapy Program [place], [place] [address] [place]      [phone] [address] [place], [place] [address]                 [place]   [facility] [phone]. [address] [place], [place] [address]   [facility] of Care          [address] Johnette Abraham  [place], [place] [address]       [phone]  [facility] [address] [place], [place] [address] [phone]  [facility]    [address] [place], [place] [address]     [place], [place] Joycelyn Man     [place] [address]     [phone]       [facility] [place] [address]  [facility]     [address], [place], [place], [place] [address] [place]     [address]     [place], [place] [address]     [phone]       [facility] [address] [place], [place] [address] [phone] Resources for Temporary Residential Assistance/Crisis [facility]) M-F 8am-3pm   [address], [place] [address]   [phone] Services include: laundry, barbering, support groups, case management, phone  & computer access, showers, AA/NA mtgs, mental health/substance abuse nurse, job skills class, disability information, VA assistance, spiritual classes, etc.   [facility]OM[facility]   [address], [place]     [place]              [employer] (women and children)       [place]. [place], [place] [address] [phone] [email or web address] for application and process Application Required  [facility]   [address]    [place]  [address]     [phone]  [place], [place] [address] [phone]([email or web address] application appt.) Application Required  [facility] (women only)    [address]     [place], [place] [address]     [phone]      Intake starts 6pm daily Need valid ID, SSC, & Police report [employer] [address] [place], [place] [phone] Application  Required  [employer] (men only)     [place].      [place], [place]       [place] (Pregnant women only) [address] [place], [place]  [facility]      [place] Dani [address].      [place], [place] [address]     [phone]             [facility] [address] [place], [place] 90 day commitment/SA/Application process  [facility](men only)     [address]     [place], [place]       Check-in at [facility] [address] [place], [place] [address] [phone] Men/Women/Women and Children must be there by 7 pm  [place], [place]                se the following resources.

## 2017-08-07 NOTE — ED Notes (Signed)
Pt's significant other is crying about situation regarding [place] [facility] D in hallway.

## 2017-10-16 IMAGING — CT CT HEAD W/O CM
4 of 5 series · 14 of 47 positions shown, 16 images · non-contrast
Comparison: None.

CLINICAL DATA: Per EMS, patient found at home by family member
unresponsive. Family member reports patient taking in alcohol and
cocaine. Patient's CBG is 509.

EXAM:
CT HEAD WITHOUT CONTRAST
TECHNIQUE: Contiguous axial images were obtained from the base of the skull
through the vertex without intravenous contrast.

[Series 2: head w/o · axial · non-contrast · 0.45mm/px · z∈[-119,-19]mm · 6 of 30 slices shown, 8 images (1 of 2)]
[im 5/30  brain]
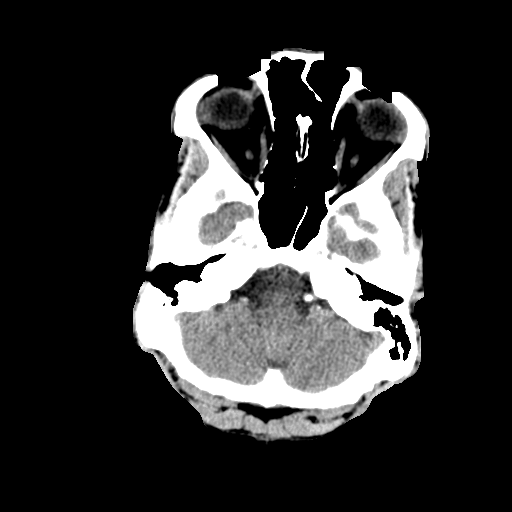
[im 5/30  bone]
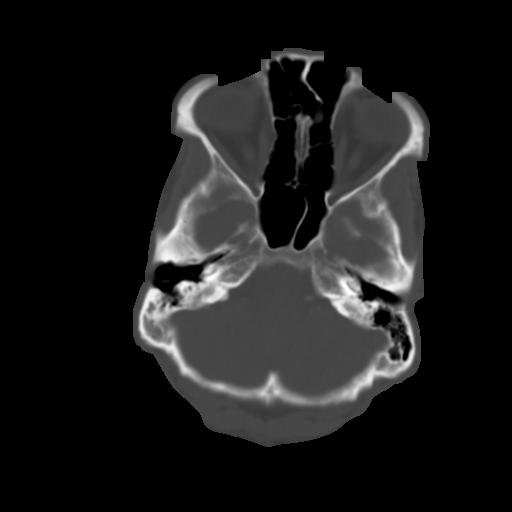
[im 9/30  brain]
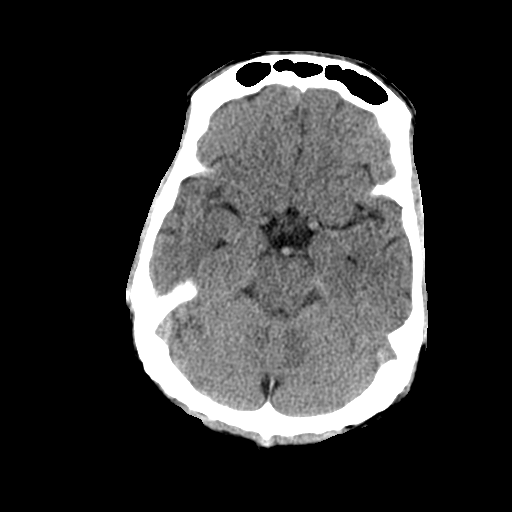
[im 13/30  brain]
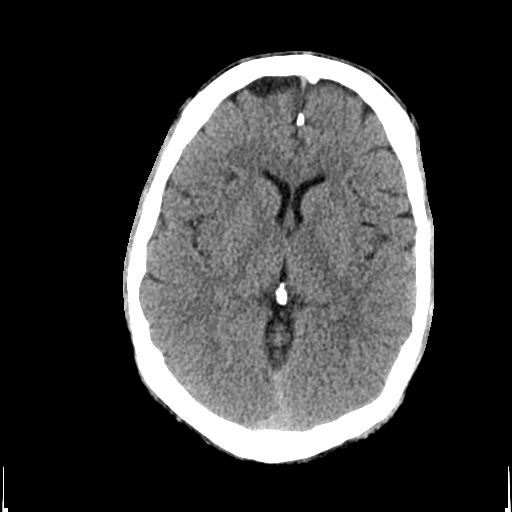
[im 17/30  brain]
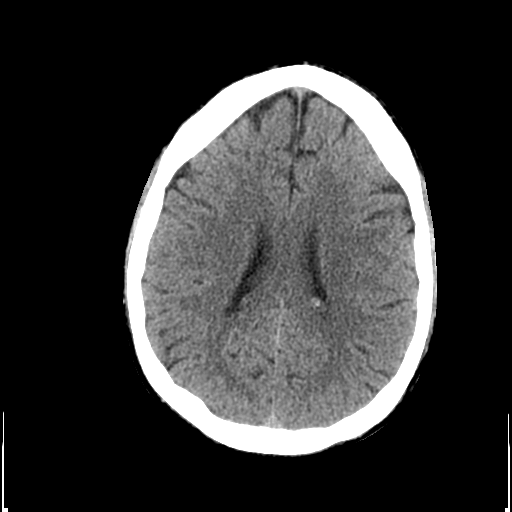
[im 21/30  brain]
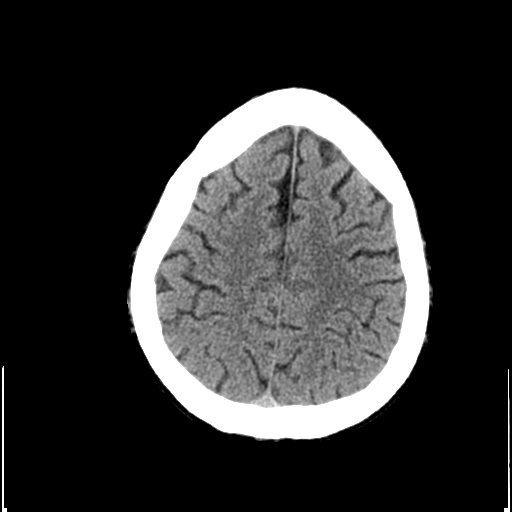
[im 21/30  bone]
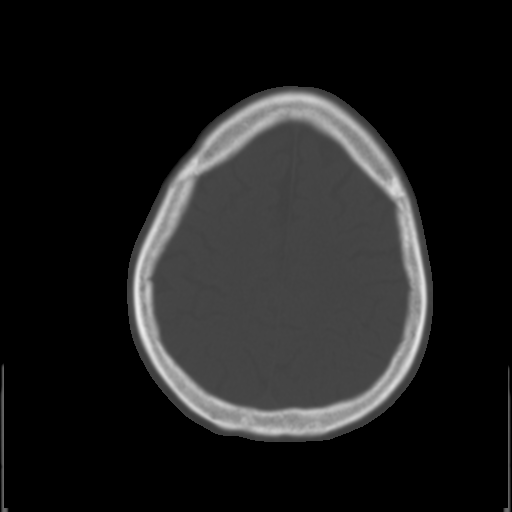
[im 25/30  brain]
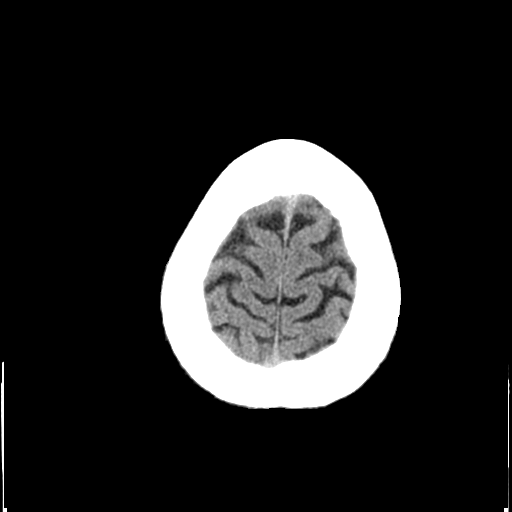

[Series 4: coronal · coronal · 0.30mm/px · 3 of 76 slices shown]
[im 26/76  brain]
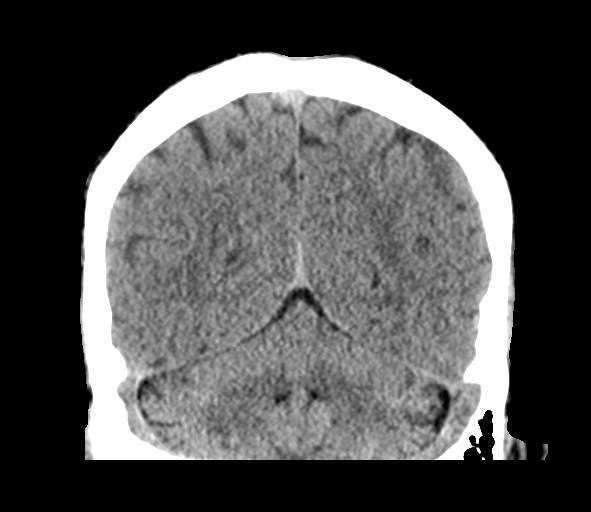
[im 34/76  brain]
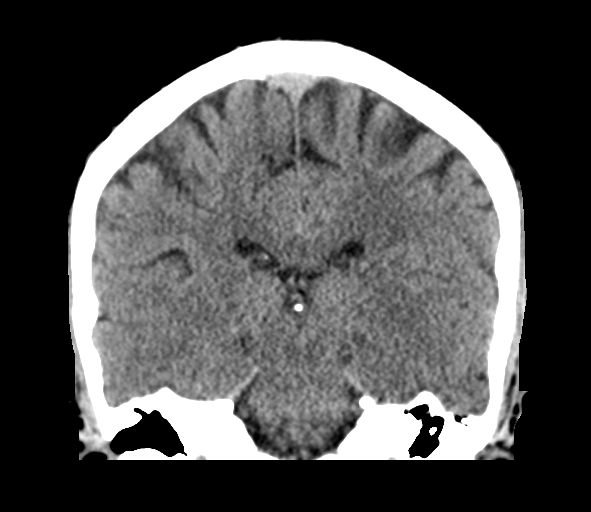
[im 42/76  brain]
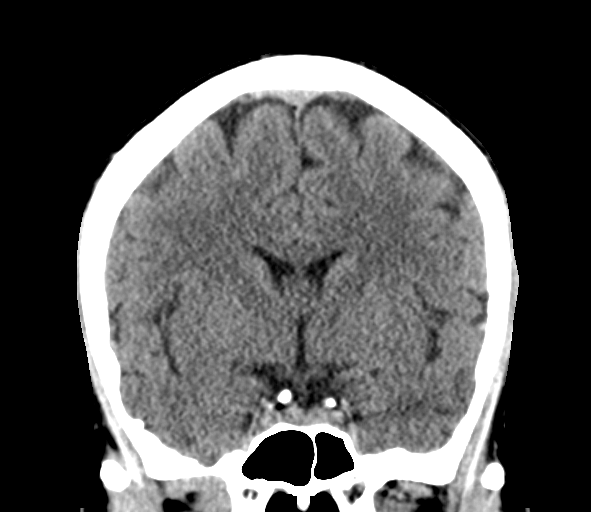

[Series 5: sagittal · sagittal · 0.29mm/px · 3 of 58 slices shown]
[im 20/58  brain]
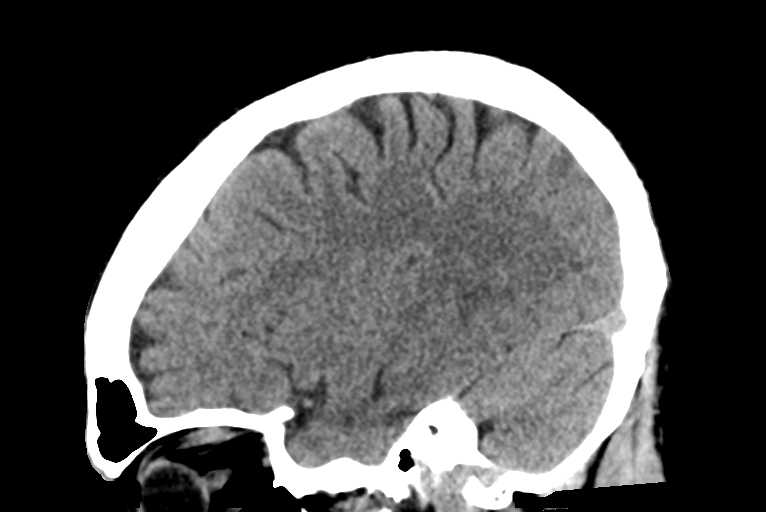
[im 29/58  brain]
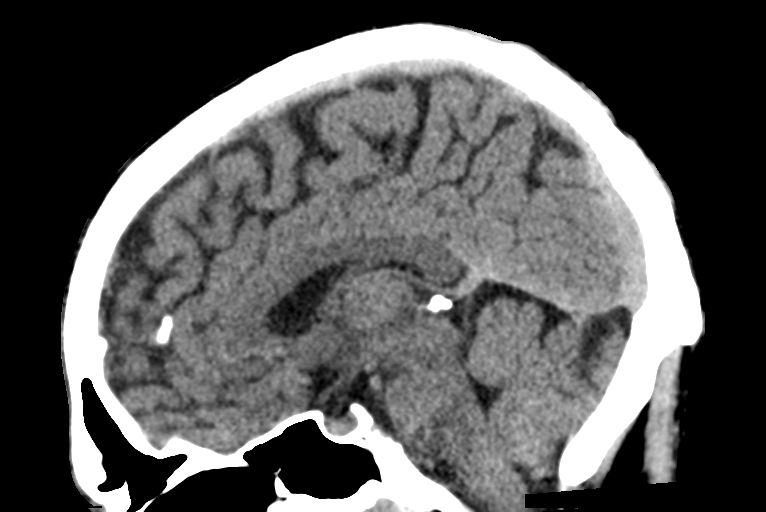
[im 39/58  brain]
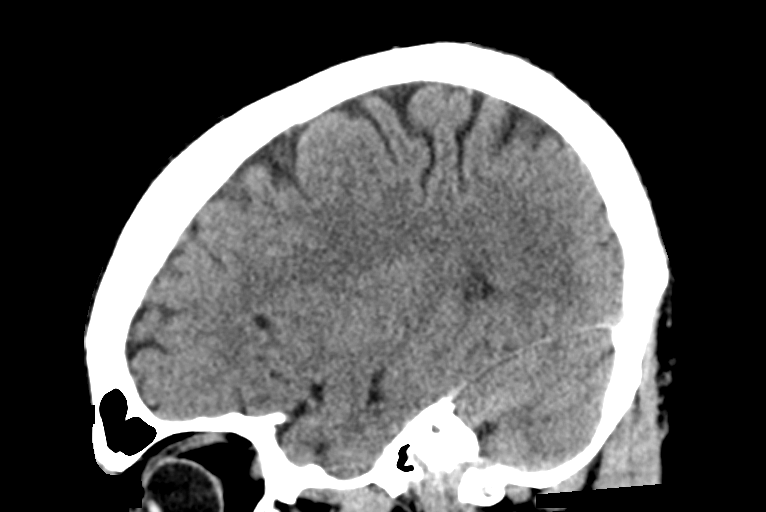

[Series 6: head w/o · axial · non-contrast · 0.32mm/px · z∈[-114,-94]mm · 2 of 29 slices shown (2 of 2)]
[im 5/29  brain]
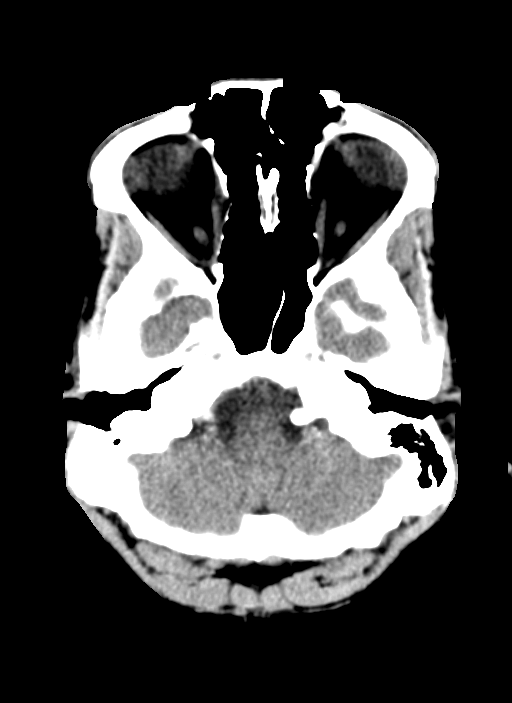
[im 9/29  brain]
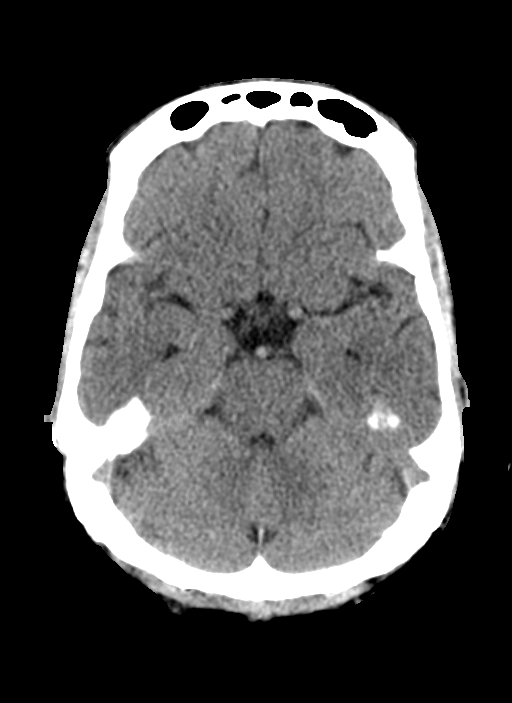

[14 of 47 positions shown; findings below may reference images not displayed]

FINDINGS: Brain: The ventricles are normal in size and configuration. There
are no parenchymal masses or mass effect. There is no evidence an
infarct.

There is a 2.2 x 1.4 cm CSF density extra-axial mass that indents
the anterior right frontal lobe. This is consistent with an
arachnoid cyst. No other extra-axial abnormalities.

There is no intracranial hemorrhage.

Vascular: No hyperdense vessel or unexpected calcification.

Skull: No skull fracture.

Sinuses/Orbits: Old medial right orbital wall fracture. Globes and
orbits otherwise unremarkable. Clear visualized sinuses. Attenuation

Other: None
IMPRESSION: 1. No acute intracranial abnormalities.
2. Right frontal arachnoid cyst, an incidental finding. No other
intracranial abnormality.

## 2017-10-16 IMAGING — DX DG CHEST 1V PORT
1 series · 1 of 1 positions shown · non-contrast
Comparison: 03/20/2014

CLINICAL DATA: Alcohol intoxication and hyperglycemia.

EXAM:
PORTABLE CHEST 1 VIEW

[chest ap]
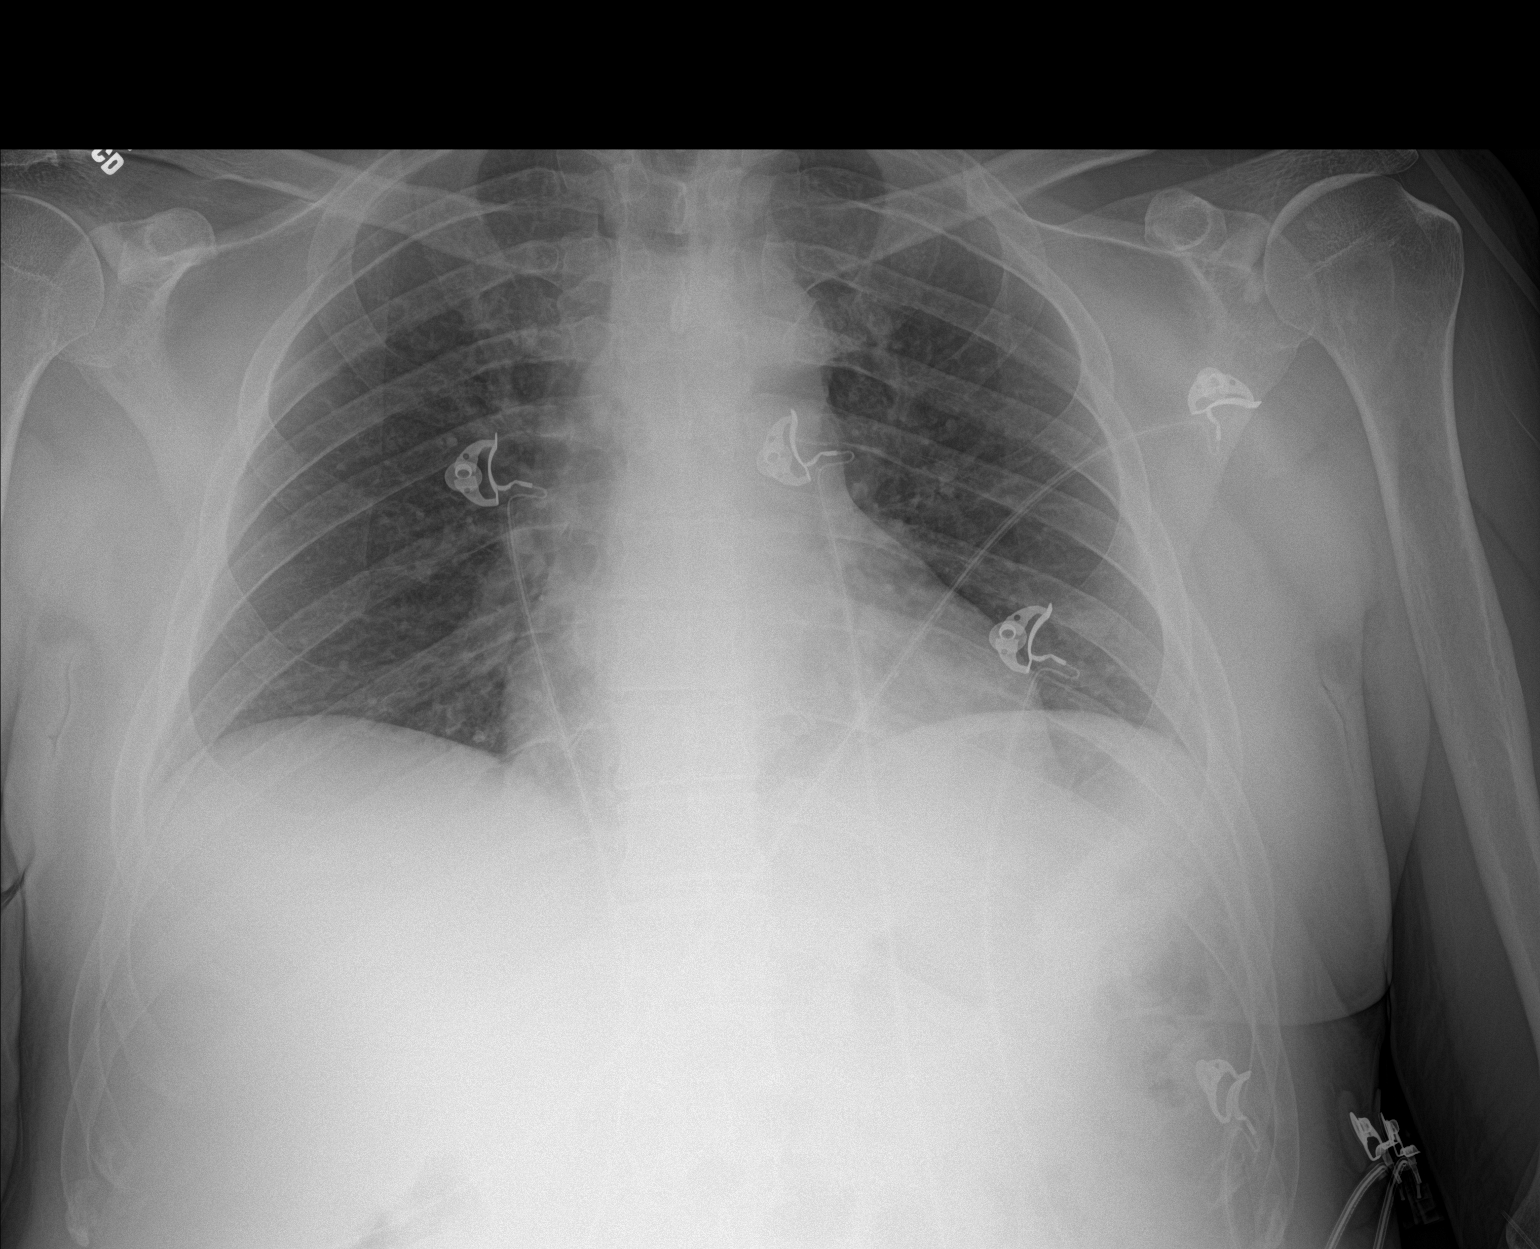

[1 of 1 positions shown; findings below may reference images not displayed]

FINDINGS: The heart size and mediastinal contours are within normal limits.
Both lungs are clear. The visualized skeletal structures are
unremarkable.
IMPRESSION: No active disease.

## 2018-10-10 ENCOUNTER — Encounter (HOSPITAL_BASED_OUTPATIENT_CLINIC_OR_DEPARTMENT_OTHER): Payer: Self-pay | Admitting: Adult Health

## 2018-10-10 ENCOUNTER — Emergency Department (HOSPITAL_BASED_OUTPATIENT_CLINIC_OR_DEPARTMENT_OTHER)
Admission: EM | Admit: 2018-10-10 | Discharge: 2018-10-10 | Disposition: A | Discharge status: Home / Self Care | Payer: BC Managed Care – PPO | Attending: Emergency Medicine | Admitting: Emergency Medicine

## 2018-10-10 ENCOUNTER — Emergency Department (HOSPITAL_BASED_OUTPATIENT_CLINIC_OR_DEPARTMENT_OTHER): Payer: BC Managed Care – PPO

## 2018-10-10 ENCOUNTER — Other Ambulatory Visit: Payer: Self-pay

## 2018-10-10 DIAGNOSIS — E119 Type 2 diabetes mellitus without complications: Secondary | ICD-10-CM | POA: Insufficient documentation

## 2018-10-10 DIAGNOSIS — Y9289 Other specified places as the place of occurrence of the external cause: Secondary | ICD-10-CM | POA: Insufficient documentation

## 2018-10-10 DIAGNOSIS — Z79899 Other long term (current) drug therapy: Secondary | ICD-10-CM | POA: Diagnosis not present

## 2018-10-10 DIAGNOSIS — Y9301 Activity, walking, marching and hiking: Secondary | ICD-10-CM | POA: Insufficient documentation

## 2018-10-10 DIAGNOSIS — S59901A Unspecified injury of right elbow, initial encounter: Secondary | ICD-10-CM | POA: Diagnosis present

## 2018-10-10 DIAGNOSIS — S5001XA Contusion of right elbow, initial encounter: Secondary | ICD-10-CM

## 2018-10-10 DIAGNOSIS — Y998 Other external cause status: Secondary | ICD-10-CM | POA: Diagnosis not present

## 2018-10-10 DIAGNOSIS — M25521 Pain in right elbow: Secondary | ICD-10-CM | POA: Diagnosis not present

## 2018-10-10 DIAGNOSIS — W010XXA Fall on same level from slipping, tripping and stumbling without subsequent striking against object, initial encounter: Secondary | ICD-10-CM | POA: Insufficient documentation

## 2018-10-10 MED ORDER — OXYCODONE-ACETAMINOPHEN 5-325 MG PO TABS
1.0000 | ORAL_TABLET | Freq: Once | ORAL | Status: AC
  Administered 2018-10-10: 1 via ORAL
  Filled 2018-10-10: qty 1

## 2018-10-10 MED ORDER — IBUPROFEN 400 MG PO TABS
600.0000 mg | ORAL_TABLET | Freq: Once | ORAL | Status: AC
  Administered 2018-10-10: 600 mg via ORAL
  Filled 2018-10-10: qty 1

## 2018-10-10 NOTE — ED Triage Notes (Signed)
Mr. Deupree tripped over a flower pot at 6 pm today and landed on his right elbow. He is complaining of pain to his right elbow and into his upper arm. CMS intact. He took Ibuprofen and aleve for pain without relief.

## 2018-10-11 NOTE — ED Provider Notes (Signed)
[facility] EMERGENCY DEPARTMENT Provider Note   CSN: [id number] Arrival date & time: 10/10/18  2124     History   Chief Complaint Chief Complaint  Patient presents with  . Elbow Injury    HPI Tyler Green is a 43 y.o. male.     HPI 43ym with r elbow pain. Fell when tripped over flower pot when taking out trash. r elbow pain since. No other injuries. Doesn't think hit head. No numbness or tingling.   Past Medical History:  Diagnosis Date  . Back pain   . Diabetes mellitus   . 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 Non compliance w medication regimen     Patient Active Problem List   Diagnosis Date Noted  . Acute lumbar myofascial strain 07/22/2017  . Lumbar pain 07/22/2017  . Hyperglycemia 07/22/2017  . Type 2 diabetes mellitus with hyperglycemia, without long-term current use of insulin ([place]) 07/22/2017  . History of diabetes mellitus 07/22/2017  . Substance abuse ([place]) 01/15/2016  . Essential hypertension, benign 12/09/2014    Past Surgical History:  Procedure Laterality Date  . brain tumor removal    . INNER EAR SURGERY          Home Medications    Prior to Admission medications   Medication Sig Start Date End Date Taking? Authorizing Provider  acetaminophen (TYLENOL) 500 MG tablet Take 1,000 mg by mouth once as needed. For pain    [provider]  atorvastatin (LIPITOR) 40 MG tablet Take 1 tablet (40 mg total) by mouth daily at 6 PM. Patient not taking: Reported on 07/22/2017 01/16/16   Debbe Odea, MD  cyclobenzaprine (FLEXERIL) 10 MG tablet Take 1 tablet (10 mg total) by mouth at bedtime. 07/22/17   Horald Pollen, MD  ibuprofen (ADVIL,MOTRIN) 200 MG tablet Take 2 tablets (400 mg total) by mouth every 8 (eight) hours as needed. For pain 01/16/16   Debbe Odea, MD  lisinopril (PRINIVIL,ZESTRIL) 10 MG tablet Take 1 tablet (10 mg total) by mouth daily. Patient not taking: Reported on 07/22/2017 01/16/16   Debbe Odea, MD   metFORMIN (GLUCOPHAGE) 1000 MG tablet Take 1 tablet (1,000 mg total) by mouth 2 (two) times daily with a meal. 07/22/17   Sagardia, Ines Bloomer, MD  RANITIDINE HCL PO Take 1 tablet by mouth daily as needed (indigestion).    [provider]    Family History Family History  Problem Relation Age of Onset  . Diabetes Mother   . Diabetes Father     Social History Social History   Tobacco Use  . Smoking status: Current Some Day Smoker    Packs/day: 0.50    Types: Cigarettes  . Smokeless tobacco: Never Used  Substance Use Topics  . Alcohol use: Yes    Alcohol/week: 0.0 standard drinks    Comment: socially  . Drug use: Not Currently    Comment: Heroin     Allergies   Patient has no known allergies.   Review of Systems Review of Systems All systems reviewed and negative, other than as noted in HPI.   Physical Exam Updated Vital Signs BP (!) 183/99 (BP Location: Left Arm)   Pulse 72   Temp 98.9 F (37.2 C) (Oral)   Resp 16   Ht 5\' 7"  (1.702 m)   Wt 68 kg   SpO2 100%   BMI 23.49 kg/m   Physical Exam Vitals signs and nursing note reviewed.  Constitutional:  General: He is not in acute distress.    Appearance: He is well-developed.  HENT:     Head: Normocephalic and atraumatic.  Eyes:     General:        Right eye: No discharge.        Left eye: No discharge.     Conjunctiva/sclera: Conjunctivae normal.  Neck:     Musculoskeletal: Neck supple.  Cardiovascular:     Rate and Rhythm: Normal rate and regular rhythm.     Heart sounds: Normal heart sounds. No murmur. No friction rub. No gallop.   Pulmonary:     Effort: Pulmonary effort is normal. No respiratory distress.     Breath sounds: Normal breath sounds.  Abdominal:     General: There is no distension.     Palpations: Abdomen is soft.     Tenderness: There is no abdominal tenderness.  Musculoskeletal:        General: Tenderness present.     Comments: ttp r elbow. No swelling or deformity.  Can range although with increased pain. NVI.   Skin:    General: Skin is warm and dry.  Neurological:     Mental Status: He is alert.  Psychiatric:        Behavior: Behavior normal.        Thought Content: Thought content normal.      ED Treatments / Results  Labs (all labs ordered are listed, but only abnormal results are displayed) Labs Reviewed - No data to display  EKG    Radiology Dg Elbow Complete Right  Result Date: 10/10/2018 CLINICAL DATA:  Fall, right elbow pain. EXAM: RIGHT ELBOW - COMPLETE 3+ VIEW COMPARISON:  None. FINDINGS: There is no evidence of fracture, dislocation, or joint effusion. There is no evidence of arthropathy or other focal bone abnormality. Soft tissues are unremarkable. IMPRESSION: Negative. Electronically Signed   By: Rolm Baptise M.D.   On: 10/10/2018 22:15    Procedures Procedures (including critical care time)  Medications Ordered in ED Medications  oxyCODONE-acetaminophen (PERCOCET/ROXICET) 5-325 MG per tablet 1 tablet (1 tablet Oral Given 10/10/18 2157)  ibuprofen (ADVIL) tablet 600 mg (600 mg Oral Given 10/10/18 2157)     Initial Impression / Assessment and Plan / ED Course  I have reviewed the triage vital signs and the nursing notes.  Pertinent labs & imaging results that were available during my care of the patient were reviewed by me and considered in my medical decision making (see chart for details).       R elbow contusion. Neg imaging. NVI. PRN nsaids. Activity as tolerated.   Final Clinical Impressions(s) / ED Diagnoses   Final diagnoses:  Contusion of right elbow, initial encounter    ED Discharge Orders    None       Virgel Manifold, MD 10/11/18 1143

## 2018-10-25 ENCOUNTER — Telehealth: Payer: Self-pay | Admitting: *Deleted

## 2018-10-25 ENCOUNTER — Other Ambulatory Visit: Payer: Self-pay | Admitting: *Deleted

## 2018-10-25 DIAGNOSIS — Z20822 Contact with and (suspected) exposure to covid-19: Secondary | ICD-10-CM

## 2018-10-25 NOTE — Telephone Encounter (Signed)
Attempted to contact pt to schedule COVID testing per Nancy Fetter, NP; left message on voicemail.

## 2018-10-26 ENCOUNTER — Other Ambulatory Visit: Payer: BC Managed Care – PPO

## 2018-10-26 DIAGNOSIS — Z20822 Contact with and (suspected) exposure to covid-19: Secondary | ICD-10-CM

## 2018-10-26 DIAGNOSIS — R6889 Other general symptoms and signs: Secondary | ICD-10-CM | POA: Diagnosis not present

## 2018-10-26 NOTE — Telephone Encounter (Signed)
Patient scheduled by supervisor Rosaria Ferries for testing

## 2018-10-29 LAB — NOVEL CORONAVIRUS, NAA: SARS-CoV-2, NAA: NOT DETECTED

## 2018-12-29 DIAGNOSIS — M5442 Lumbago with sciatica, left side: Secondary | ICD-10-CM | POA: Diagnosis not present

## 2018-12-29 DIAGNOSIS — M25552 Pain in left hip: Secondary | ICD-10-CM | POA: Diagnosis not present

## 2018-12-29 DIAGNOSIS — Z1159 Encounter for screening for other viral diseases: Secondary | ICD-10-CM | POA: Diagnosis not present

## 2019-01-08 DIAGNOSIS — N289 Disorder of kidney and ureter, unspecified: Secondary | ICD-10-CM | POA: Diagnosis not present

## 2019-11-20 DIAGNOSIS — R4020 Unspecified coma: Secondary | ICD-10-CM | POA: Diagnosis not present

## 2019-11-20 DIAGNOSIS — T50904A Poisoning by unspecified drugs, medicaments and biological substances, undetermined, initial encounter: Secondary | ICD-10-CM | POA: Diagnosis not present

## 2019-11-20 DIAGNOSIS — R0689 Other abnormalities of breathing: Secondary | ICD-10-CM | POA: Diagnosis not present

## 2019-11-20 DIAGNOSIS — R404 Transient alteration of awareness: Secondary | ICD-10-CM | POA: Diagnosis not present

## 2019-11-28 ENCOUNTER — Other Ambulatory Visit: Payer: Self-pay

## 2019-11-28 ENCOUNTER — Emergency Department (HOSPITAL_BASED_OUTPATIENT_CLINIC_OR_DEPARTMENT_OTHER): Payer: BC Managed Care – PPO

## 2019-11-28 ENCOUNTER — Encounter (HOSPITAL_BASED_OUTPATIENT_CLINIC_OR_DEPARTMENT_OTHER): Payer: Self-pay | Admitting: *Deleted

## 2019-11-28 DIAGNOSIS — S199XXA Unspecified injury of neck, initial encounter: Secondary | ICD-10-CM | POA: Diagnosis not present

## 2019-11-28 DIAGNOSIS — F17210 Nicotine dependence, cigarettes, uncomplicated: Secondary | ICD-10-CM | POA: Diagnosis not present

## 2019-11-28 DIAGNOSIS — R079 Chest pain, unspecified: Secondary | ICD-10-CM | POA: Diagnosis not present

## 2019-11-28 DIAGNOSIS — S0990XA Unspecified injury of head, initial encounter: Secondary | ICD-10-CM | POA: Diagnosis not present

## 2019-11-28 DIAGNOSIS — R0789 Other chest pain: Secondary | ICD-10-CM | POA: Diagnosis not present

## 2019-11-28 DIAGNOSIS — R0902 Hypoxemia: Secondary | ICD-10-CM | POA: Diagnosis not present

## 2019-11-28 DIAGNOSIS — Z7984 Long term (current) use of oral hypoglycemic drugs: Secondary | ICD-10-CM | POA: Diagnosis not present

## 2019-11-28 DIAGNOSIS — Y929 Unspecified place or not applicable: Secondary | ICD-10-CM | POA: Insufficient documentation

## 2019-11-28 DIAGNOSIS — U071 COVID-19: Secondary | ICD-10-CM | POA: Diagnosis not present

## 2019-11-28 DIAGNOSIS — J690 Pneumonitis due to inhalation of food and vomit: Secondary | ICD-10-CM | POA: Diagnosis not present

## 2019-11-28 DIAGNOSIS — S0081XA Abrasion of other part of head, initial encounter: Secondary | ICD-10-CM | POA: Diagnosis not present

## 2019-11-28 DIAGNOSIS — E119 Type 2 diabetes mellitus without complications: Secondary | ICD-10-CM | POA: Diagnosis not present

## 2019-11-28 DIAGNOSIS — J90 Pleural effusion, not elsewhere classified: Secondary | ICD-10-CM | POA: Diagnosis not present

## 2019-11-28 DIAGNOSIS — Z9289 Personal history of other medical treatment: Secondary | ICD-10-CM | POA: Insufficient documentation

## 2019-11-28 DIAGNOSIS — I499 Cardiac arrhythmia, unspecified: Secondary | ICD-10-CM | POA: Diagnosis not present

## 2019-11-28 DIAGNOSIS — I213 ST elevation (STEMI) myocardial infarction of unspecified site: Secondary | ICD-10-CM | POA: Diagnosis not present

## 2019-11-28 DIAGNOSIS — T401X1A Poisoning by heroin, accidental (unintentional), initial encounter: Secondary | ICD-10-CM | POA: Diagnosis not present

## 2019-11-28 DIAGNOSIS — R0602 Shortness of breath: Secondary | ICD-10-CM | POA: Diagnosis not present

## 2019-11-28 DIAGNOSIS — S2241XA Multiple fractures of ribs, right side, initial encounter for closed fracture: Secondary | ICD-10-CM | POA: Insufficient documentation

## 2019-11-28 DIAGNOSIS — Y939 Activity, unspecified: Secondary | ICD-10-CM | POA: Insufficient documentation

## 2019-11-28 DIAGNOSIS — I2699 Other pulmonary embolism without acute cor pulmonale: Secondary | ICD-10-CM | POA: Diagnosis not present

## 2019-11-28 DIAGNOSIS — S2242XA Multiple fractures of ribs, left side, initial encounter for closed fracture: Secondary | ICD-10-CM | POA: Insufficient documentation

## 2019-11-28 DIAGNOSIS — S2243XA Multiple fractures of ribs, bilateral, initial encounter for closed fracture: Secondary | ICD-10-CM | POA: Diagnosis not present

## 2019-11-28 DIAGNOSIS — F1910 Other psychoactive substance abuse, uncomplicated: Secondary | ICD-10-CM | POA: Diagnosis not present

## 2019-11-28 DIAGNOSIS — Y999 Unspecified external cause status: Secondary | ICD-10-CM | POA: Insufficient documentation

## 2019-11-28 DIAGNOSIS — Y848 Other medical procedures as the cause of abnormal reaction of the patient, or of later complication, without mention of misadventure at the time of the procedure: Secondary | ICD-10-CM | POA: Diagnosis not present

## 2019-11-28 DIAGNOSIS — I10 Essential (primary) hypertension: Secondary | ICD-10-CM | POA: Diagnosis not present

## 2019-11-28 DIAGNOSIS — Y92510 Bank as the place of occurrence of the external cause: Secondary | ICD-10-CM | POA: Diagnosis not present

## 2019-11-28 DIAGNOSIS — I4510 Unspecified right bundle-branch block: Secondary | ICD-10-CM | POA: Diagnosis not present

## 2019-11-28 DIAGNOSIS — I469 Cardiac arrest, cause unspecified: Secondary | ICD-10-CM | POA: Diagnosis not present

## 2019-11-28 DIAGNOSIS — S2231XA Fracture of one rib, right side, initial encounter for closed fracture: Secondary | ICD-10-CM | POA: Diagnosis not present

## 2019-11-28 DIAGNOSIS — I468 Cardiac arrest due to other underlying condition: Secondary | ICD-10-CM | POA: Diagnosis not present

## 2019-11-28 DIAGNOSIS — X58XXXA Exposure to other specified factors, initial encounter: Secondary | ICD-10-CM | POA: Insufficient documentation

## 2019-11-28 DIAGNOSIS — T50901A Poisoning by unspecified drugs, medicaments and biological substances, accidental (unintentional), initial encounter: Secondary | ICD-10-CM | POA: Diagnosis not present

## 2019-11-28 DIAGNOSIS — J1282 Pneumonia due to coronavirus disease 2019: Secondary | ICD-10-CM | POA: Diagnosis not present

## 2019-11-28 DIAGNOSIS — R918 Other nonspecific abnormal finding of lung field: Secondary | ICD-10-CM | POA: Diagnosis not present

## 2019-11-28 DIAGNOSIS — R2243 Localized swelling, mass and lump, lower limb, bilateral: Secondary | ICD-10-CM | POA: Diagnosis not present

## 2019-11-28 NOTE — ED Triage Notes (Signed)
Pt c/o bil rib and chest wall pain from " I had CPR done on me when I overdosed x 1 week ago "

## 2019-11-29 ENCOUNTER — Emergency Department (HOSPITAL_BASED_OUTPATIENT_CLINIC_OR_DEPARTMENT_OTHER)
Admission: EM | Admit: 2019-11-29 | Discharge: 2019-11-29 | Disposition: A | Discharge status: Home / Self Care | Payer: BC Managed Care – PPO | Source: Home / Self Care | Attending: Emergency Medicine | Admitting: Emergency Medicine

## 2019-11-29 DIAGNOSIS — S2241XA Multiple fractures of ribs, right side, initial encounter for closed fracture: Secondary | ICD-10-CM

## 2019-11-29 DIAGNOSIS — S2242XA Multiple fractures of ribs, left side, initial encounter for closed fracture: Secondary | ICD-10-CM

## 2019-11-29 DIAGNOSIS — Z9289 Personal history of other medical treatment: Secondary | ICD-10-CM

## 2019-11-29 DIAGNOSIS — T401X1A Poisoning by heroin, accidental (unintentional), initial encounter: Secondary | ICD-10-CM

## 2019-11-29 DIAGNOSIS — R0789 Other chest pain: Secondary | ICD-10-CM

## 2019-11-29 HISTORY — DX: Opioid abuse, uncomplicated: F11.10

## 2019-11-29 HISTORY — DX: Other psychoactive substance abuse, uncomplicated: F19.10

## 2019-11-29 MED ORDER — KETOROLAC TROMETHAMINE 30 MG/ML IJ SOLN
30.0000 mg | Freq: Once | INTRAMUSCULAR | Status: AC
  Administered 2019-11-29: 30 mg via INTRAMUSCULAR
  Filled 2019-11-29: qty 1

## 2019-11-29 MED ORDER — NAPROXEN 500 MG PO TABS: ORAL_TABLET | ORAL | 0 refills | Status: DC

## 2019-11-29 NOTE — ED Provider Notes (Signed)
[facility] DEPT [facility] Provider Note: Georgena Spurling, MD, FACEP  CSN: [id number] MRN: [id number] ARRIVAL: 11/28/19 at 2058 ROOM: [place]  Rib Injury   HISTORY OF PRESENT ILLNESS  11/29/19 2:13 AM DYSON SEVEY is a 44 y.o. male who had CPR performed on him when he overdosed on heroin about a week ago. He did not seek further medical attention after bystanders revived him. He has subsequently had bilateral anterior rib pain. He rates the pain as a 4 out of 10 at rest but worse with coughing or movement. When he coughs he feels a sharp, stabbing pain. He finds it difficult to take a deep breath although he is not short of breath.    Past Medical History:  Diagnosis Date  . Back pain   . Diabetes mellitus   . 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 Heroin abuse ([place])   . IV drug abuse ([place])   . Non compliance w medication regimen     Past Surgical History:  Procedure Laterality Date  . brain tumor removal    . INNER EAR SURGERY      Family History  Problem Relation Age of Onset  . Diabetes Mother   . Diabetes Father     Social History   Tobacco Use  . Smoking status: Current Some Day Smoker    Packs/day: 0.50    Types: Cigarettes  . Smokeless tobacco: Never Used  Substance Use Topics  . Alcohol use: Yes    Alcohol/week: 0.0 standard drinks    Comment: socially  . Drug use: Not Currently    Comment: Heroin    Prior to Admission medications   Medication Sig Start Date End Date Taking? Authorizing Provider  metFORMIN (GLUCOPHAGE) 1000 MG tablet Take 1 tablet (1,000 mg total) by mouth 2 (two) times daily with a meal. 07/22/17   Sagardia, Ines Bloomer, MD  naproxen (NAPROSYN) 500 MG tablet Take 1 tablet twice daily for rib pain. 11/29/19   Codi Folkerts, MD  RANITIDINE HCL PO Take 1 tablet by mouth daily as needed (indigestion).    [provider]  atorvastatin (LIPITOR) 40 MG tablet Take 1 tablet (40 mg total) by mouth daily at 6  PM. Patient not taking: Reported on 07/22/2017 01/16/16 11/29/19  Debbe Odea, MD  lisinopril (PRINIVIL,ZESTRIL) 10 MG tablet Take 1 tablet (10 mg total) by mouth daily. Patient not taking: Reported on 07/22/2017 01/16/16 11/29/19  Debbe Odea, MD    Allergies Patient has no known allergies.   REVIEW OF SYSTEMS  Negative except as noted here or in the History of Present Illness.   PHYSICAL EXAMINATION  Initial Vital Signs Blood pressure 126/84, pulse 77, temperature 98.4 F (36.9 C), temperature source Oral, resp. rate 18, height 5\' 8"  (1.727 m), weight 68 kg, SpO2 100 %.  Examination General: Well-developed, well-nourished male in no acute distress; appearance consistent with age of record HENT: normocephalic; atraumatic Eyes: pupils equal, round and reactive to light; extraocular muscles intact Neck: supple Heart: regular rate and rhythm Lungs: clear to auscultation bilaterally chest: Bilateral anterior chest wall tenderness without deformity or crepitus Abdomen: soft; nondistended; nontender; bowel sounds present Extremities: No deformity; full range of motion; pulses normal Neurologic: Awake, alert and oriented; motor function intact in all extremities and symmetric; no facial droop Skin: Warm and dry Psychiatric: Grimacing   RESULTS  Summary of this visit's results, reviewed and interpreted by myself:   EKG Interpretation  Date/Time:  Ventricular Rate:    PR Interval:    QRS Duration:   QT Interval:    QTC Calculation:   R Axis:     Text Interpretation:        Laboratory Studies: No results found for this or any previous visit (from the past 24 hour(s)). Imaging Studies: DG Ribs Bilateral  Result Date: 11/28/2019 CLINICAL DATA:  Status post CPR 1 week ago. EXAM: BILATERAL RIBS - 3+ VIEW COMPARISON:  None. FINDINGS: Acute nondisplaced second, third, fourth, fifth and sixth right rib fractures are seen. Ill-defined lateral sixth and seventh left rib fractures of  indeterminate age are suspected. The visualized portions of the lungs are unremarkable. IMPRESSION: 1. Acute nondisplaced second, third, fourth, fifth and sixth right rib fractures. 2. Ill-defined lateral sixth and seventh left rib fractures of indeterminate age. Electronically Signed   By: Virgina Norfolk M.D.   On: 11/28/2019 21:54   DG Sternum  Result Date: 11/28/2019 CLINICAL DATA:  Status post recent CPR. EXAM: STERNUM - 2+ VIEW COMPARISON:  None. FINDINGS: The study is limited secondary to patient positioning. An ill-defined area of cortical irregularity is seen along the inferior aspect of the body of the sternum. IMPRESSION: Cortical irregularity involving the inferior aspect of the body of the sternum. An acute fracture deformity cannot be excluded. Electronically Signed   By: Virgina Norfolk M.D.   On: 11/28/2019 21:56   CT Chest Wo Contrast  Result Date: 11/28/2019 CLINICAL DATA:  Status post recent CPR. EXAM: CT CHEST WITHOUT CONTRAST TECHNIQUE: Multidetector CT imaging of the chest was performed following the standard protocol without IV contrast. COMPARISON:  None. FINDINGS: Cardiovascular: No significant vascular findings. Normal heart size. No pericardial effusion. Mediastinum/Nodes: No enlarged mediastinal or axillary lymph nodes. Thyroid gland, trachea, and esophagus demonstrate no significant findings. Lungs/Pleura: There is no evidence of acute infiltrate. A very small left pleural effusion is seen. No pneumothorax is identified. Upper Abdomen: No acute abnormality. Musculoskeletal: Acute anterior lateral second through seventh right rib fractures are seen. Acute anterior second through seventh left rib fractures are also noted. The area of cortical irregularity is seen involving the sternum on the prior sternal plain films demonstrates no corresponding defect on the current CT. IMPRESSION: 1. Multiple acute bilateral anterior rib fractures. 2. Very small left pleural effusion.  Electronically Signed   By: Virgina Norfolk M.D.   On: 11/28/2019 22:39    ED COURSE and MDM  Nursing notes, initial and subsequent vitals signs, including pulse oximetry, reviewed and interpreted by myself.  Vitals:   11/28/19 2106 11/28/19 2107 11/29/19 0126  BP:  (!) 141/82 126/84  Pulse:  84 77  Resp:  18 18  Temp:  98.2 F (36.8 C) 98.4 F (36.9 C)  TempSrc:  Oral Oral  SpO2:  100% 100%  Weight: 68 kg    Height: 5\' 8"  (1.727 m)     Medications  ketorolac (TORADOL) 30 MG/ML injection 30 mg (has no administration in time range)    Patient advised of multiple bilateral rib fractures. There is no pneumothorax or other significant intrathoracic pathology seen. He has been able to maintain his breathing for the past week so I do not believe admission is indicated at this time. Because of his heroin addiction it is not appropriate to give him narcotics but we will start him on NSAIDs and provide an incentive spirometer.  PROCEDURES  Procedures   ED DIAGNOSES     ICD-10-CM   1. Heroin overdose, accidental or  unintentional, initial encounter ([place])  T40.1X1A            3. Successful cardiopulmonary resuscitation  Z92.89   4. Closed fracture of multiple ribs of left side, initial encounter  S22.42XA   5. Closed fracture of multiple ribs of right side, initial encounter  S22.41XA        Taquila Leys, Jenny Reichmann, MD 11/29/19 [phone]

## 2019-12-02 ENCOUNTER — Other Ambulatory Visit: Payer: Self-pay

## 2019-12-02 ENCOUNTER — Emergency Department (HOSPITAL_COMMUNITY): Payer: BC Managed Care – PPO

## 2019-12-02 ENCOUNTER — Encounter (HOSPITAL_COMMUNITY): Payer: Self-pay | Admitting: Emergency Medicine

## 2019-12-02 ENCOUNTER — Inpatient Hospital Stay (HOSPITAL_COMMUNITY)
Admission: EM | Admit: 2019-12-02 | Discharge: 2019-12-10 | DRG: 177 | Disposition: A | Discharge status: Home / Self Care | Payer: BC Managed Care – PPO | Attending: Internal Medicine | Admitting: Internal Medicine

## 2019-12-02 DIAGNOSIS — R0902 Hypoxemia: Secondary | ICD-10-CM | POA: Diagnosis present

## 2019-12-02 DIAGNOSIS — I10 Essential (primary) hypertension: Secondary | ICD-10-CM | POA: Diagnosis present

## 2019-12-02 DIAGNOSIS — E119 Type 2 diabetes mellitus without complications: Secondary | ICD-10-CM | POA: Diagnosis present

## 2019-12-02 DIAGNOSIS — F17210 Nicotine dependence, cigarettes, uncomplicated: Secondary | ICD-10-CM | POA: Diagnosis present

## 2019-12-02 DIAGNOSIS — I468 Cardiac arrest due to other underlying condition: Secondary | ICD-10-CM | POA: Diagnosis present

## 2019-12-02 DIAGNOSIS — S2249XA Multiple fractures of ribs, unspecified side, initial encounter for closed fracture: Secondary | ICD-10-CM

## 2019-12-02 DIAGNOSIS — Y848 Other medical procedures as the cause of abnormal reaction of the patient, or of later complication, without mention of misadventure at the time of the procedure: Secondary | ICD-10-CM | POA: Diagnosis present

## 2019-12-02 DIAGNOSIS — J690 Pneumonitis due to inhalation of food and vomit: Secondary | ICD-10-CM | POA: Diagnosis present

## 2019-12-02 DIAGNOSIS — Z7984 Long term (current) use of oral hypoglycemic drugs: Secondary | ICD-10-CM

## 2019-12-02 DIAGNOSIS — Y92510 Bank as the place of occurrence of the external cause: Secondary | ICD-10-CM | POA: Diagnosis not present

## 2019-12-02 DIAGNOSIS — T50901A Poisoning by unspecified drugs, medicaments and biological substances, accidental (unintentional), initial encounter: Secondary | ICD-10-CM | POA: Diagnosis present

## 2019-12-02 DIAGNOSIS — I2699 Other pulmonary embolism without acute cor pulmonale: Secondary | ICD-10-CM | POA: Diagnosis present

## 2019-12-02 DIAGNOSIS — S2243XA Multiple fractures of ribs, bilateral, initial encounter for closed fracture: Secondary | ICD-10-CM | POA: Diagnosis present

## 2019-12-02 DIAGNOSIS — J1282 Pneumonia due to coronavirus disease 2019: Secondary | ICD-10-CM | POA: Diagnosis present

## 2019-12-02 DIAGNOSIS — T401X1A Poisoning by heroin, accidental (unintentional), initial encounter: Secondary | ICD-10-CM | POA: Diagnosis present

## 2019-12-02 DIAGNOSIS — S0081XA Abrasion of other part of head, initial encounter: Secondary | ICD-10-CM | POA: Diagnosis present

## 2019-12-02 DIAGNOSIS — F1910 Other psychoactive substance abuse, uncomplicated: Secondary | ICD-10-CM | POA: Diagnosis present

## 2019-12-02 DIAGNOSIS — S2239XA Fracture of one rib, unspecified side, initial encounter for closed fracture: Secondary | ICD-10-CM

## 2019-12-02 DIAGNOSIS — R0602 Shortness of breath: Secondary | ICD-10-CM | POA: Diagnosis present

## 2019-12-02 DIAGNOSIS — U071 COVID-19: Secondary | ICD-10-CM | POA: Diagnosis present

## 2019-12-02 DIAGNOSIS — J189 Pneumonia, unspecified organism: Secondary | ICD-10-CM

## 2019-12-02 HISTORY — DX: Other pulmonary embolism without acute cor pulmonale: I26.99

## 2019-12-02 LAB — RAPID URINE DRUG SCREEN, HOSP PERFORMED
Amphetamines: POSITIVE — AB
Barbiturates: NOT DETECTED
Benzodiazepines: POSITIVE — AB
Cocaine: NOT DETECTED
Opiates: NOT DETECTED
Tetrahydrocannabinol: NOT DETECTED

## 2019-12-02 LAB — CBC
HCT: 39.6 % (ref 39.0–52.0)
Hemoglobin: 13 g/dL (ref 13.0–17.0)
MCH: 30 pg (ref 26.0–34.0)
MCHC: 32.8 g/dL (ref 30.0–36.0)
MCV: 91.5 fL (ref 80.0–100.0)
Platelets: 221 10*3/uL (ref 150–400)
RBC: 4.33 MIL/uL (ref 4.22–5.81)
RDW: 11.9 % (ref 11.5–15.5)
WBC: 6.6 10*3/uL (ref 4.0–10.5)
nRBC: 0 % (ref 0.0–0.2)

## 2019-12-02 LAB — COMPREHENSIVE METABOLIC PANEL
ALT: 28 U/L (ref 0–44)
AST: 30 U/L (ref 15–41)
Albumin: 3 g/dL — ABNORMAL LOW (ref 3.5–5.0)
Alkaline Phosphatase: 83 U/L (ref 38–126)
Anion gap: 11 (ref 5–15)
BUN: 8 mg/dL (ref 6–20)
CO2: 25 mmol/L (ref 22–32)
Calcium: 8.3 mg/dL — ABNORMAL LOW (ref 8.9–10.3)
Chloride: 102 mmol/L (ref 98–111)
Creatinine, Ser: 0.82 mg/dL (ref 0.61–1.24)
GFR calc Af Amer: >60 mL/min (ref >60)
GFR calc non Af Amer: >60 mL/min (ref >60)
Glucose, Bld: 217 mg/dL — ABNORMAL HIGH (ref 70–99)
Potassium: 3.5 mmol/L (ref 3.5–5.1)
Sodium: 138 mmol/L (ref 135–145)
Total Bilirubin: 0.4 mg/dL (ref 0.3–1.2)
Total Protein: 5.8 g/dL — ABNORMAL LOW (ref 6.5–8.1)

## 2019-12-02 LAB — I-STAT ARTERIAL BLOOD GAS, ED
Acid-Base Excess: 0 mmol/L (ref 0.0–2.0)
Bicarbonate: 27.6 mmol/L (ref 20.0–28.0)
Calcium, Ion: 1.19 mmol/L (ref 1.15–1.40)
HCT: 37 % — ABNORMAL LOW (ref 39.0–52.0)
Hemoglobin: 12.6 g/dL — ABNORMAL LOW (ref 13.0–17.0)
O2 Saturation: 97 %
Patient temperature: 99
Potassium: 3.9 mmol/L (ref 3.5–5.1)
Sodium: 140 mmol/L (ref 135–145)
TCO2: 29 mmol/L (ref 22–32)
pCO2 arterial: 58.3 mmHg — ABNORMAL HIGH (ref 32.0–48.0)
pH, Arterial: 7.284 — ABNORMAL LOW (ref 7.350–7.450)
pO2, Arterial: 105 mmHg (ref 83.0–108.0)

## 2019-12-02 LAB — CBG MONITORING, ED
Glucose-Capillary: 166 mg/dL — ABNORMAL HIGH (ref 70–99)
Glucose-Capillary: 191 mg/dL — ABNORMAL HIGH (ref 70–99)
Glucose-Capillary: 63 mg/dL — ABNORMAL LOW (ref 70–99)
Glucose-Capillary: 66 mg/dL — ABNORMAL LOW (ref 70–99)
Glucose-Capillary: 85 mg/dL (ref 70–99)

## 2019-12-02 LAB — HEMOGLOBIN A1C
Hgb A1c MFr Bld: 5.8 % — ABNORMAL HIGH (ref 4.8–5.6)
Mean Plasma Glucose: 119.76 mg/dL

## 2019-12-02 LAB — SARS CORONAVIRUS 2 BY RT PCR (HOSPITAL ORDER, PERFORMED IN ~~LOC~~ HOSPITAL LAB): SARS Coronavirus 2: POSITIVE — AB

## 2019-12-02 LAB — ETHANOL: Alcohol, Ethyl (B): <10 mg/dL (ref <10)

## 2019-12-02 MED ORDER — INSULIN ASPART 100 UNIT/ML ~~LOC~~ SOLN
0.0000 [IU] | Freq: Three times a day (TID) | SUBCUTANEOUS | Status: DC
  Administered 2019-12-03: 3 [IU] via SUBCUTANEOUS
  Administered 2019-12-03: 2 [IU] via SUBCUTANEOUS

## 2019-12-02 MED ORDER — DEXAMETHASONE 6 MG PO TABS
6.0000 mg | ORAL_TABLET | Freq: Every day | ORAL | Status: AC
  Administered 2019-12-02 – 2019-12-06 (×5): 6 mg via ORAL
  Filled 2019-12-02 (×2): qty 1
  Filled 2019-12-02 (×2): qty 2
  Filled 2019-12-02: qty 1

## 2019-12-02 MED ORDER — HEPARIN (PORCINE) 25000 UT/250ML-% IV SOLN
1100.0000 [IU]/h | INTRAVENOUS | Status: DC
  Filled 2019-12-02: qty 250

## 2019-12-02 MED ORDER — NALOXONE HCL 0.4 MG/ML IJ SOLN
0.4000 mg | Freq: Once | INTRAMUSCULAR | Status: AC
  Administered 2019-12-02: 0.4 mg via INTRAVENOUS
  Filled 2019-12-02: qty 1

## 2019-12-02 MED ORDER — INSULIN ASPART 100 UNIT/ML ~~LOC~~ SOLN
0.0000 [IU] | Freq: Every day | SUBCUTANEOUS | Status: DC
  Administered 2019-12-04: 2 [IU] via SUBCUTANEOUS
  Administered 2019-12-05 – 2019-12-08 (×3): 3 [IU] via SUBCUTANEOUS
  Administered 2019-12-09: 2 [IU] via SUBCUTANEOUS

## 2019-12-02 MED ORDER — HEPARIN BOLUS VIA INFUSION
4000.0000 [IU] | Freq: Once | INTRAVENOUS | Status: AC
  Administered 2019-12-02: 4000 [IU] via INTRAVENOUS
  Filled 2019-12-02: qty 4000

## 2019-12-02 MED ORDER — VANCOMYCIN HCL 750 MG/150ML IV SOLN
750.0000 mg | Freq: Three times a day (TID) | INTRAVENOUS | Status: DC
  Filled 2019-12-02: qty 150

## 2019-12-02 MED ORDER — SODIUM CHLORIDE 0.9 % IV SOLN: 500.0000 mg | Freq: Once | INTRAVENOUS | Status: DC

## 2019-12-02 MED ORDER — SODIUM CHLORIDE 0.9 % IV SOLN
100.0000 mg | Freq: Every day | INTRAVENOUS | Status: AC
  Administered 2019-12-03 – 2019-12-06 (×4): 100 mg via INTRAVENOUS
  Filled 2019-12-02 (×5): qty 20

## 2019-12-02 MED ORDER — SODIUM CHLORIDE 0.9 % IV SOLN
200.0000 mg | Freq: Once | INTRAVENOUS | Status: AC
  Administered 2019-12-02: 200 mg via INTRAVENOUS
  Filled 2019-12-02: qty 40

## 2019-12-02 MED ORDER — VANCOMYCIN HCL 1500 MG/300ML IV SOLN
1500.0000 mg | Freq: Once | INTRAVENOUS | Status: DC
  Administered 2019-12-02: 1500 mg via INTRAVENOUS
  Filled 2019-12-02: qty 300

## 2019-12-02 MED ORDER — HEPARIN BOLUS VIA INFUSION
4000.0000 [IU] | Freq: Once | INTRAVENOUS | Status: DC
  Filled 2019-12-02: qty 4000

## 2019-12-02 MED ORDER — HEPARIN (PORCINE) 25000 UT/250ML-% IV SOLN
1500.0000 [IU]/h | INTRAVENOUS | Status: DC
  Administered 2019-12-02: 1100 [IU]/h via INTRAVENOUS
  Administered 2019-12-03: 1300 [IU]/h via INTRAVENOUS
  Administered 2019-12-04 – 2019-12-05 (×2): 1500 [IU]/h via INTRAVENOUS
  Filled 2019-12-02 (×4): qty 250

## 2019-12-02 MED ORDER — ENOXAPARIN SODIUM 80 MG/0.8ML ~~LOC~~ SOLN
70.0000 mg | Freq: Two times a day (BID) | SUBCUTANEOUS | Status: DC
  Filled 2019-12-02 (×2): qty 0.7

## 2019-12-02 MED ORDER — SODIUM CHLORIDE 0.9 % IV BOLUS
1000.0000 mL | Freq: Once | INTRAVENOUS | Status: AC
  Administered 2019-12-02: 1000 mL via INTRAVENOUS

## 2019-12-02 MED ORDER — HYDROMORPHONE HCL 1 MG/ML IJ SOLN: 1.0000 mg | INTRAMUSCULAR | Status: DC | PRN

## 2019-12-02 MED ORDER — METHADONE HCL 10 MG PO TABS
20.0000 mg | ORAL_TABLET | Freq: Two times a day (BID) | ORAL | Status: DC
  Administered 2019-12-02 – 2019-12-04 (×4): 20 mg via ORAL
  Filled 2019-12-02 (×4): qty 2

## 2019-12-02 MED ORDER — SODIUM CHLORIDE 0.9 % IV SOLN: 1.0000 g | Freq: Once | INTRAVENOUS | Status: DC

## 2019-12-02 MED ORDER — PIPERACILLIN-TAZOBACTAM 3.375 G IVPB 30 MIN
3.3750 g | Freq: Once | INTRAVENOUS | Status: AC
  Administered 2019-12-02: 3.375 g via INTRAVENOUS
  Filled 2019-12-02: qty 50

## 2019-12-02 MED ORDER — NALOXONE HCL 0.4 MG/ML IJ SOLN: 0.4000 mg | INTRAMUSCULAR | Status: DC | PRN

## 2019-12-02 MED ORDER — IOHEXOL 350 MG/ML SOLN
100.0000 mL | Freq: Once | INTRAVENOUS | Status: AC | PRN
  Administered 2019-12-02: 100 mL via INTRAVENOUS

## 2019-12-02 MED ORDER — SODIUM CHLORIDE 0.9 % IV SOLN
3.0000 g | Freq: Four times a day (QID) | INTRAVENOUS | Status: DC
  Administered 2019-12-03 – 2019-12-04 (×4): 3 g via INTRAVENOUS
  Filled 2019-12-02 (×9): qty 8

## 2019-12-02 MED ORDER — IPRATROPIUM-ALBUTEROL 0.5-2.5 (3) MG/3ML IN SOLN: 3.0000 mL | RESPIRATORY_TRACT | Status: DC | PRN

## 2019-12-02 NOTE — H&P (Addendum)
Date: 12/02/2019               Patient Name:  Tyler [name] MRN: [id number]  DOB: 1976/04/05 Age / Sex: 44 y.o., male   PCP: Patient, No Pcp Per         Medical Service: Internal Medicine Teaching Service         Attending Physician: Dr. Jimmye Norman, Elaina Pattee, MD    First Contact: Dr. Alexandria Lodge Pager: [phone]  Second Contact: Dr. Modena Nunnery Pager: [phone]       After Hours (After 5p/  First Contact Pager: [phone]  weekends / holidays): Second Contact Pager: [phone]   Chief Complaint: heroin overdose  History of Present Illness:   Tyler [name] is a 44 year old man who uses heroin intranasally with history of type 2 diabetes who presents after accidental heroin overdose and receiving CPR earlier this morning.  The patient and his long-term girlfriend contributed to this history.   The patient reports earlier this morning he snorted heroin and drove to the bank to withdraw money from an ATM, but he is unable to report what happened from there. According to EMS report to ED nursing, the patient was found unresponsive by bystanders at a bank. The patient reportedly received ~10 minutes of CPR by bystanders before EMS arrived and gave the patient narcan and bagged him for ~5 minutes before he became more responsive. He was then transported to the ED.    This is reportedly the patient's second heroin overdose in the last two weeks. He was seen in the ED 3 days ago on 8/5 for bilateral anterior rib pain after his girlfriend performed CPR on him after she found him unresponsive after using heroin. Imaging at that time demonstrated several bilateral rib fractures.  On evaluation at bedside, the patient states he has pain in his chest at rest which worsens with coughing and movement. He also endorses shortness of breath, which he attributes to rib pain. He reports that yesterday he developed a productive cough and felt fatigued with subjective fever/chills. Denies headache,  nausea/vomiting, abdominal pain, dizziness, dysuria. Neither he nor his girlfriend received a COVID vaccine.   He reports a near daily use of heroin. Notes that he does not inject it as he is afraid of needles. He states he would like to quit using heroin. States this is the only illicit substance he currently uses/abuses. Notes he began using 2-3 years ago to treat his whole body (legs>arms) neuropathic pain. Tried rehab for cocaine "years ago." His girlfriend bought him methadone off the street to try yesterday, and he states it had good effect. States his typical withdrawal symptoms include feeling like he is overheating, nausea/vomiting/diarrhea, and pilorection.   ED course: he was noted to be tachycardic in the 100s, hypertensive to 163/102, satting 96% on 15L nasal cannula. He was given narcan x1. Notable labs included CBC (WBC 6.6), unremarkable CMP, I-stat ABG with pH 7.284 and pCO2 58.3, positive COVID-19 swab, UDS positive for benzodiazepines and amphetamines. Blood cultures x 2 were collected. CT Angio Chest PE protocol demonstrated pulmonary embolism, bilateral lower lobe consolidations, and multiple bilateral rib fractures.   Meds: metformin (per patient, he only intermittently takes it) No outpatient medications have been marked as taking for the 12/02/19 encounter [facility] Encounter).    Allergies: Allergies as of 12/02/2019  . (No Known Allergies)   Past Medical History:  Diagnosis Date  . Back pain   . Diabetes mellitus   .  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 Heroin abuse ([place])   . IV drug abuse ([place])   . Non compliance w medication regimen     Family History: Noncontributory   Social History: Lives at home with his girlfriend of 14 years. Works as a Games developer. Used cocaine in the past.   Review of Systems: A complete ROS was negative except as per HPI.   Physical Exam: Blood pressure 129/82, pulse 100, temperature 99 F (37.2 C), temperature source Oral,  resp. rate 11, SpO2 96 %. Constitutional: Groggy- and disheveled-appearing man laying on stretcher, appears to nod off to sleep periodically however arouses to voice/light touch HENT: Normocephalic atraumatic, mucous membranes moist, no pharyngeal erythema Eyes: Pupils 2 mm and equally reactive to light Neck: Supple, no cervical lymphadenopathy Cardiovascular: Tachycardic, regular rhythm, no murmurs rubs or gallops Pulmonary/Chest: Normal work of breathing satting 90-93% on 5L [place], rhonchorous throughout except for diminished breath sounds in the bilateral bases, tenderness to palpation over sternum Abdominal: soft, non-tender, non-distended Neurological: Oriented to person, place, time, and situation; 5/5 strength in bilateral upper extremities and lower extremities Skin: Abrasions over bilateral patella  EKG: personally reviewed my interpretation is sinus tachycardia; QTc 486  CXR: personally reviewed my interpretation is bilateral lower lobe opacities  Assessment & Plan by Problem: Active Problems:   Drug overdose, accidental or unintentional, initial encounter  Tyler [name] is a 44 year old man who uses heroin intranasally with history of type 2 diabetes who presented to the ED after accidental heroin overdose and receiving 10 minutes of CPR. Found to have pulmonary embolism, aspiration pneumonia vs aspiration pneumonitis, and COVID-19.   Aspiration pneumonia vs aspiration pneumonitis Presented with productive cough and shortness of breath and found to have a new oxygen requirement in the setting of two recent heroin overdoses which involved CPR (once about two weeks ago and the day of this admission). CT demonstrated bilateral lower lobe consolidations. High suspicion for aspiration pneumonia vs aspiration pneumonitis given his high risk for aspiration. Will treat with IV antibiotics, encourage use of incentive spirometer, and monitor for clinical improvement.  - Ampicillin-sulbactam (Unasyn)  3 g IV q6h - MRSA PCR screening pending - Incentive spirometry - Continuous pulse ox; Wean O2 as tolerated - Duonebs q4h PRN  Pulmonary Embolism Found on CT chest PE protocol. Unsure at this time whether provoked or unprovoked. Will treat with heparin as patient is at high risk for hemothorax with his multiple rib fractures.  - Heparin infusion   COVID-19 positive Reports subjective fever/chills, productive cough, and shortness of breath starting one day prior to this admission. He is unvaccinated. With concomitant aspiration pneumonia vs aspiration pneumonitis and pulmonary embolism, difficult to say whether these symptoms are truly secondary to his COVID infection. We will treat for symptomatic COVID-19 infection. - Decadron 6 mg daily x 5 days - Remdesivir 100 mg IV daily x 4 doses  Multiple rib fractures Secondary to trauma from CPR performed on the day of this admission and two weeks ago after the first heroin overdose. Pain management is vital for his recovery from his aspiration pneumonitis vs pneumonia. Will treat with methadone (see below) and PRN Dilaudid with possible titration recognizing the patient's likely high tolerance for opioids. Will obtain daily CXR as the patient is at risk for developing hemothorax because of heparin and/or flail chest given the multiple fractures. - Pain control with   - 20 mg methadone BID  - IV Dilaudid 1 mg q4h PRN for  persistent pain - Daily CXR  Opiate Use Disorder Reports a near daily use of heroin for the last 2-3 years, last use the morning of this admission. Tried methadone as recently as yesterday. Given his current hospital problems, would like to help him avoid withdrawal for maximum chance for healing/recovery. - Start methadone 20 mg BID with low threshold to increase - 8/8 EKG with QTc of 486 - COWS - Will ask social work for resources available in the community  Type 2 Diabetes Patient reports he is prescribed metformin however  states he only checks it intermittently. - Check Hgb A1c - SSI  Diet: Regular DVT ppx: Heparin  Dispo: Admit patient to Inpatient with expected length of stay greater than 2 midnights.  Signed: Alexandria Lodge, MD 12/02/2019, 5:12 PM  Pager: [phone] After 5pm on weekdays and 1pm on weekends: On Call pager: [phone]

## 2019-12-02 NOTE — Progress Notes (Addendum)
ANTICOAGULATION CONSULT NOTE - Initial Consult  Pharmacy Consult for heparin Indication: pulmonary embolus  No Known Allergies  Patient Measurements:   Heparin Dosing Weight: TBW  Vital Signs: Temp: 99 F (37.2 C) (08/08 1327) Temp Source: Oral (08/08 1327) BP: 129/82 (08/08 1443) Pulse Rate: 100 (08/08 1443)  Labs: Recent Labs    12/02/19 1336  HGB 13.0  HCT 39.6  PLT 221  CREATININE 0.82    Estimated Creatinine Clearance: 111.7 mL/min (by C-G formula based on SCr of 0.82 mg/dL).   Medical History: Past Medical History:  Diagnosis Date  . Back pain   . Diabetes mellitus   . 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 Heroin abuse ([place])   . IV drug abuse ([place])   . Non compliance w medication regimen    Assessment: 80 YOM presenting as unresponsive, A&Ox4 s/p narcan.  Previous complaints of chest pain (CPR earlier this week for similar episode), PE on CT scan, no anticoagulation PTA.    Goal of Therapy:  Heparin level 0.3-0.7 units/ml Monitor platelets by anticoagulation protocol: Yes   Plan:  Heparin 4000 units IV x 1, and gtt at 1100 units/hr F/u 6 hour heparin level   Addendum: Now to switch to lovenox per primary D/c heparin gtt Lovenox 1mg /kg (68mg ) SQ every 12 hours  2nd Addendum: Now being consulted to start heparin again See above, original plan  Bertis Ruddy, PharmD Clinical Pharmacist ED Pharmacist Phone # [phone] 12/02/2019 3:36 PM

## 2019-12-02 NOTE — ED Notes (Signed)
Dinner tray delivered.

## 2019-12-02 NOTE — Consult Note (Signed)
.   [name], MRN:  [id number], DOB:  Feb 17, 1976, LOS: 0 ADMISSION DATE:  12/02/2019, CONSULTATION DATE: 12/02/2019 REFERRING MD: EDP, CHIEF COMPLAINT: Overdose with cardiac arrest  Brief History   44 year old polysubstance abuser overdose with second time 1 week  History of present illness   44 year old male smoker who overdosed for the second time in a week snorting heroin 1 prior to going to the bank and passed out.  Bystanders performed 8 minutes of CPR.  Note he had rib fractures from previous CPR performed on November 28, 2019.  He is brought to [facility] emergency department treated with Narcan became arousable and follows commands.  Pulmonary critical care asked to evaluate we have offered him pulmonary toilet wean his oxygen and we will be available as needed.  Past Medical History  Hypertension Polysubstance abuse Diabetes  Significant Hospital Events     Consults:  12/02/2019 pulmonary critical care  Procedures:  12/02/2019 8 minutes of CPR in the bank  Significant Diagnostic Tests:  CT of the chest with reported PE multiple rib fractures   Micro Data:  12/02/2019 blood cultures x2  Antimicrobials:  Vancomycin Zosyn  Interim history/subjective:  Overdose with heroin second time in 2 weeks  Objective   Blood pressure 129/82, pulse 100, temperature 99 F (37.2 C), temperature source Oral, resp. rate 11, SpO2 96 %.       No intake or output data in the 24 hours ending 12/02/19 1639 There were no vitals filed for this visit.  Examination: General: Disheveled male who follows commands moves all extremities HENT: Noted to have abrasion posterior scalp along with abrasions around left eye Lungs: Coarse rhonchi bilaterally copious drainage in of her pharynx Cardiovascular: Heart sounds are regular regular rate and rhythm Abdomen: Soft nontender Extremities: Multiple abrasions Neuro: Somewhat lethargic grossly intact   Resolved Hospital Problem list      Assessment & Plan:  Hypoxia in the setting of cardiopulmonary arrest following overdose of heroin  requiring CPR.  Noted to have old rib fractures from November 28, 2019. Wean FiO2 for sats greater than 88% Pulmonary toilet Serial chest x-rays Bronchodilators as needed Stop smoking  Polysubstance abuse Substance abuse counseling offered Per primary  Reported pulmonary embolism Heparin per primary    Best practice:  Diet: Heart healthy diet Pain/Anxiety/Delirium protocol (if indicated): Avoid sedation as able VAP protocol (if indicated): Not indicated DVT prophylaxis: Heparin GI prophylaxis: PPI Glucose control: Sliding scale insulin protocol Mobility: Mobilize as able Code Status: Full Family Communication: Patient and wife updated at bedside 12/02/2019 Disposition: Admit to stepdown unit [facility] hospitalist service pulmonary critical care will sign off at this time  Labs   CBC: Recent Labs  Lab 12/02/19 1336 12/02/19 1541  WBC 6.6  --   HGB 13.0 12.6*  HCT 39.6 37.0*  MCV 91.5  --   PLT 221  --     Basic Metabolic Panel: Recent Labs  Lab 12/02/19 1336 12/02/19 1541  NA 138 140  K 3.5 3.9  CL 102  --   CO2 25  --   GLUCOSE 217*  --   BUN 8  --   CREATININE 0.82  --   CALCIUM 8.3*  --    GFR: Estimated Creatinine Clearance: 111.7 mL/min (by C-G formula based on SCr of 0.82 mg/dL). Recent Labs  Lab 12/02/19 1336  WBC 6.6    Liver Function Tests: Recent Labs  Lab 12/02/19 1336  AST 30  ALT 28  ALKPHOS 83  BILITOT 0.4  PROT 5.8*  ALBUMIN 3.0*   No results for input(s): LIPASE, AMYLASE in the last 168 hours. No results for input(s): AMMONIA in the last 168 hours.  ABG    Component Value Date/Time   PHART 7.284 (L) 12/02/2019 1541   PCO2ART 58.3 (H) 12/02/2019 1541   PO2ART 105 12/02/2019 1541   HCO3 27.6 12/02/2019 1541   TCO2 29 12/02/2019 1541   ACIDBASEDEF 0.6 01/14/2016 1800   O2SAT 97.0 12/02/2019 1541     Coagulation  Profile: No results for input(s): INR, PROTIME in the last 168 hours.  Cardiac Enzymes: No results for input(s): CKTOTAL, CKMB, CKMBINDEX, TROPONINI in the last 168 hours.  HbA1C: Hemoglobin A1C  Date/Time Value Ref Range Status  07/22/2017 04:22 PM 10.1  Final  12/09/2014 02:00 PM 10.2  Final    CBG: Recent Labs  Lab 12/02/19 1325  GLUCAP 191*    Review of Systems:   10 point review of system taken, please see HPI for positives and negatives.   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   Surgical History    Past Surgical History:  Procedure Laterality Date  . brain tumor removal    . INNER EAR SURGERY       Social History   reports that he has been smoking cigarettes. He has been smoking about 0.50 packs per day. He has never used smokeless tobacco. He reports current alcohol use. He reports previous drug use.   Family History   His family history includes Diabetes in his father and mother.   Allergies No Known Allergies   Home Medications  Prior to Admission medications   Medication Sig Start Date End Date Taking? Authorizing Provider  metFORMIN (GLUCOPHAGE) 1000 MG tablet Take 1 tablet (1,000 mg total) by mouth 2 (two) times daily with a meal. 07/22/17   Sagardia, Ines Bloomer, MD  naproxen (NAPROSYN) 500 MG tablet Take 1 tablet twice daily for rib pain. 11/29/19   Molpus, John, MD  RANITIDINE HCL PO Take 1 tablet by mouth daily as needed (indigestion).    [provider]  atorvastatin (LIPITOR) 40 MG tablet Take 1 tablet (40 mg total) by mouth daily at 6 PM. Patient not taking: Reported on 07/22/2017 01/16/16 11/29/19  Debbe Odea, MD  lisinopril (PRINIVIL,ZESTRIL) 10 MG tablet Take 1 tablet (10 mg total) by mouth daily. Patient not taking: Reported on 07/22/2017 01/16/16 11/29/19  Debbe Odea, MD      Critical care time: 23 min
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
## 2019-12-02 NOTE — Progress Notes (Signed)
Pharmacy Antibiotic Note  Tyler [name] is a 44 y.o. male admitted on 12/02/2019 unresponsive, CPR performed however responded to narcan (multiple similar episodes this month with heroin use) with hypoxia and concern for pna.  Pharmacy has been consulted for vancomycin dosing.  Gram neg coverage per MD.    Plan: Vancomycin 1500 mg IV x 1, then 750 mg IV every 12 hours (target vancomycin trough 15-20) Add MRSA PCR Monitor renal function, PCR and clinical progression to narrow Vancomycin trough at steady state     Temp (24hrs), Avg:99 F (37.2 C), Min:99 F (37.2 C), Max:99 F (37.2 C)  Recent Labs  Lab 12/02/19 1336  WBC 6.6  CREATININE 0.82    Estimated Creatinine Clearance: 111.7 mL/min (by C-G formula based on SCr of 0.82 mg/dL).    No Known Allergies  Bertis Ruddy, PharmD Clinical Pharmacist ED Pharmacist Phone # [phone] 12/02/2019 4:13 PM
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
## 2019-12-02 NOTE — ED Notes (Signed)
Pt given ED happy meal and cup of sprite per RN, Crystal B.

## 2019-12-02 NOTE — ED Provider Notes (Signed)
Patient CARE signed out to continue to monitor, reassess, wean oxygen follow-up CT scan results.  CT scan results reviewed showing pulmonary embolism, concern for aspiration pneumonia and multiple bilateral rib fractures.  Patient had significant episode after overdose today and had another one recently as well requiring CPR.  On exam patient does say a few words with loud verbal discussion, gives me a thumbs up on a nonrebreather.  Patient is tachypneic in the room.  Updated patient and significant other.  Discussed with ICU for assessment need admission to stepdown or critical care.  IV antibiotics given for aspiration pneumonia given 2 events, hypoxia and CT findings of aspiration.  .Critical Care Performed by: Elnora Morrison, MD Authorized by: Elnora Morrison, MD   Critical care provider statement:    Critical care time (minutes):  40   Critical care start time:  12/02/2019 3:35 PM   Critical care end time:  12/02/2019 4:15 PM   Critical care time was exclusive of:  Separately billable procedures and treating other patients and teaching time   Critical care was necessary to treat or prevent imminent or life-threatening deterioration of the following conditions:  Respiratory failure   Critical care was time spent personally by me on the following activities:  Discussions with consultants, evaluation of patient's response to treatment, examination of patient, ordering and performing treatments and interventions, ordering and review of laboratory studies, ordering and review of radiographic studies, pulse oximetry, re-evaluation of patient's condition and obtaining history from patient or surrogate      Elnora Morrison, MD 12/02/19 2347

## 2019-12-02 NOTE — ED Notes (Signed)
Pt transferred to 5L [place]

## 2019-12-03 ENCOUNTER — Inpatient Hospital Stay (HOSPITAL_COMMUNITY): Payer: BC Managed Care – PPO

## 2019-12-03 LAB — CBG MONITORING, ED
Glucose-Capillary: 138 mg/dL — ABNORMAL HIGH (ref 70–99)
Glucose-Capillary: 177 mg/dL — ABNORMAL HIGH (ref 70–99)
Glucose-Capillary: 209 mg/dL — ABNORMAL HIGH (ref 70–99)
Glucose-Capillary: 183 mg/dL — ABNORMAL HIGH (ref 70–99)

## 2019-12-03 LAB — FIBRINOGEN: Fibrinogen: 543 mg/dL — ABNORMAL HIGH (ref 210–475)

## 2019-12-03 LAB — COMPREHENSIVE METABOLIC PANEL
ALT: 27 U/L (ref 0–44)
AST: 24 U/L (ref 15–41)
Albumin: 2.9 g/dL — ABNORMAL LOW (ref 3.5–5.0)
Alkaline Phosphatase: 74 U/L (ref 38–126)
Anion gap: 9 (ref 5–15)
BUN: 7 mg/dL (ref 6–20)
CO2: 29 mmol/L (ref 22–32)
Calcium: 8.3 mg/dL — ABNORMAL LOW (ref 8.9–10.3)
Chloride: 99 mmol/L (ref 98–111)
Creatinine, Ser: 0.83 mg/dL (ref 0.61–1.24)
GFR calc Af Amer: >60 mL/min (ref >60)
GFR calc non Af Amer: >60 mL/min (ref >60)
Glucose, Bld: 162 mg/dL — ABNORMAL HIGH (ref 70–99)
Potassium: 4.3 mmol/L (ref 3.5–5.1)
Sodium: 137 mmol/L (ref 135–145)
Total Bilirubin: 0.3 mg/dL (ref 0.3–1.2)
Total Protein: 5.9 g/dL — ABNORMAL LOW (ref 6.5–8.1)

## 2019-12-03 LAB — C-REACTIVE PROTEIN: CRP: 8.2 mg/dL — ABNORMAL HIGH (ref <1.0)

## 2019-12-03 LAB — CBC
HCT: 39.3 % (ref 39.0–52.0)
Hemoglobin: 12.8 g/dL — ABNORMAL LOW (ref 13.0–17.0)
MCH: 29.8 pg (ref 26.0–34.0)
MCHC: 32.6 g/dL (ref 30.0–36.0)
MCV: 91.6 fL (ref 80.0–100.0)
Platelets: 208 10*3/uL (ref 150–400)
RBC: 4.29 MIL/uL (ref 4.22–5.81)
RDW: 12.2 % (ref 11.5–15.5)
WBC: 9.6 10*3/uL (ref 4.0–10.5)
nRBC: 0 % (ref 0.0–0.2)

## 2019-12-03 LAB — HEPARIN LEVEL (UNFRACTIONATED)
Heparin Unfractionated: 0.18 IU/mL — ABNORMAL LOW (ref 0.30–0.70)
Heparin Unfractionated: 0.22 IU/mL — ABNORMAL LOW (ref 0.30–0.70)

## 2019-12-03 LAB — LACTATE DEHYDROGENASE: LDH: 155 U/L (ref 98–192)

## 2019-12-03 LAB — FERRITIN: Ferritin: 284 ng/mL (ref 24–336)

## 2019-12-03 LAB — D-DIMER, QUANTITATIVE: D-Dimer, Quant: 0.77 ug/mL-FEU — ABNORMAL HIGH (ref 0.00–0.50)

## 2019-12-03 LAB — HIV ANTIBODY (ROUTINE TESTING W REFLEX): HIV Screen 4th Generation wRfx: NONREACTIVE

## 2019-12-03 MED ORDER — NICOTINE 21 MG/24HR TD PT24
21.0000 mg | MEDICATED_PATCH | Freq: Every day | TRANSDERMAL | Status: DC
  Administered 2019-12-03 – 2019-12-10 (×8): 21 mg via TRANSDERMAL
  Filled 2019-12-03 (×8): qty 1

## 2019-12-03 MED ORDER — HEPARIN BOLUS VIA INFUSION
2000.0000 [IU] | Freq: Once | INTRAVENOUS | Status: AC
  Administered 2019-12-03: 2000 [IU] via INTRAVENOUS
  Filled 2019-12-03: qty 2000

## 2019-12-03 MED ORDER — INSULIN ASPART 100 UNIT/ML ~~LOC~~ SOLN
0.0000 [IU] | Freq: Three times a day (TID) | SUBCUTANEOUS | Status: DC
  Administered 2019-12-03: 7 [IU] via SUBCUTANEOUS
  Administered 2019-12-04: 4 [IU] via SUBCUTANEOUS
  Administered 2019-12-04: 7 [IU] via SUBCUTANEOUS
  Administered 2019-12-04: 3 [IU] via SUBCUTANEOUS
  Administered 2019-12-05: 7 [IU] via SUBCUTANEOUS
  Administered 2019-12-05: 3 [IU] via SUBCUTANEOUS
  Administered 2019-12-05: 4 [IU] via SUBCUTANEOUS
  Administered 2019-12-06: 3 [IU] via SUBCUTANEOUS
  Administered 2019-12-06: 4 [IU] via SUBCUTANEOUS
  Administered 2019-12-06 – 2019-12-07 (×2): 3 [IU] via SUBCUTANEOUS
  Administered 2019-12-07 – 2019-12-08 (×2): 7 [IU] via SUBCUTANEOUS
  Administered 2019-12-09 (×2): 11 [IU] via SUBCUTANEOUS
  Administered 2019-12-09 – 2019-12-10 (×2): 3 [IU] via SUBCUTANEOUS
  Administered 2019-12-10: 4 [IU] via SUBCUTANEOUS

## 2019-12-03 NOTE — ED Notes (Signed)
Dinner ordered 

## 2019-12-03 NOTE — Progress Notes (Signed)
ANTICOAGULATION CONSULT NOTE   Pharmacy Consult for heparin Indication: pulmonary embolus  No Known Allergies  Patient Measurements: Heparin Dosing Weight: 68kg  Vital Signs: BP: 156/88 (08/09 1400) Pulse Rate: 65 (08/09 1400)  Labs: Recent Labs    12/02/19 1336 12/02/19 1336 12/02/19 1541 12/03/19 0601 12/03/19 1244  HGB 13.0   < > 12.6* 12.8*  --   HCT 39.6  --  37.0* 39.3  --   PLT 221  --   --  208  --   HEPARINUNFRC  --   --   --  0.18* 0.22*  CREATININE 0.82  --   --  0.83  --    < > = values in this interval not displayed.    Estimated Creatinine Clearance: 110.4 mL/min (by C-G formula based on SCr of 0.83 mg/dL).  Assessment: Tyler Green presenting as unresponsive, A&Ox4 s/p narcan. He continues on IV heparin for new PE. Heparin level remains subtherapeutic. CBC is stable. No bleeding noted.   Goal of Therapy:  Heparin level 0.3-0.7 units/ml Monitor platelets by anticoagulation protocol: Yes   Plan:  Re-bolus heparin 2000 units IV x 1 Increase heparin gtt to 1450 units/hr Check a 6 hr heparin level Daily heparin level and CBC  Salome Arnt, PharmD, BCPS Clinical Pharmacist Please see AMION for all pharmacy numbers 12/03/2019 3:03 PM

## 2019-12-03 NOTE — ED Notes (Signed)
Dinner delivered.

## 2019-12-03 NOTE — Progress Notes (Signed)
ANTICOAGULATION CONSULT NOTE  Pharmacy Consult for heparin Indication: pulmonary embolus  No Known Allergies  Patient Measurements:   Heparin Dosing Weight: TBW  Vital Signs:    Labs: Recent Labs    12/02/19 1336 12/02/19 1336 12/02/19 1541 12/03/19 0601  HGB 13.0   < > 12.6* 12.8*  HCT 39.6  --  37.0* 39.3  PLT 221  --   --  208  HEPARINUNFRC  --   --   --  0.18*  CREATININE 0.82  --   --   --    < > = values in this interval not displayed.    Estimated Creatinine Clearance: 111.7 mL/min (by C-G formula based on SCr of 0.82 mg/dL).   Medical History: Past Medical History:  Diagnosis Date  . Back pain   . Diabetes mellitus   . 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 Heroin abuse ([place])   . IV drug abuse ([place])   . Non compliance w medication regimen    Assessment: 76 YOM presenting as unresponsive, A&Ox4 s/p narcan.  Previous complaints of chest pain (CPR earlier this week for similar episode), PE on CT scan, no anticoagulation PTA.  Heparin level 0.18 units/ml   Goal of Therapy:  Heparin level 0.3-0.7 units/ml Monitor platelets by anticoagulation protocol: Yes   Plan:  Heparin 2000 units IV x 1 and increase gtt to 1300 units/hr F/u 6 hour heparin level   Thanks for allowing pharmacy to be a part of this patient's care.  Excell Seltzer, PharmD Clinical Pharmacist

## 2019-12-03 NOTE — ED Notes (Signed)
Breakfast delivered  

## 2019-12-03 NOTE — ED Notes (Signed)
Lunch Tray Ordered @ 1033. 

## 2019-12-03 NOTE — ED Notes (Signed)
Lunch delivered. 

## 2019-12-03 NOTE — ED Notes (Signed)
PT was placed on hospital bed for comfort.  He was able to stand and pivot to a chair and then back to the bed, did become very SOB but recovered quickly.

## 2019-12-03 NOTE — Progress Notes (Addendum)
Subjective:   No acute events overnight.  Patient evaluated at bedside this morning. Tyler Green doesn't remember much about yesterday, but is able to tell us that he was brought in by ambulance after overdosing on heroin. He immediately says that he wants to get into a rehab which we commended him for.   We updated him on all the conditions we're treating him for, including PE, rib fractures, COVID-19 and possible aspiration pneumonia.   He has not noticed any of his usual symptoms of withdrawal, including nausea, diarrhea, sweating, and the sensation that his skin is on fire.   Breathing is doing ok at rest and if he is elevated at 45 degrees. Pain is manageable without moving around. He states he is coughing quite a bit of mucous up.   He feels quite overwhelmed by everything that's going on. Wants to be sure we update his girlfriend and mom.  Objective:  Vital signs in last 24 hours: Vitals:   12/02/19 1327 12/02/19 1400 12/02/19 1443 12/02/19 1752  BP: (!) 163/102 136/81 129/82 (!) 127/96  Pulse: (!) 109 (!) 103 100 81  Resp: 19 20 11 14   Temp: 99 F (37.2 C)     TempSrc: Oral     SpO2: 96% 93% 96% 98%   Physical Exam: Constitutional: Tired- and disheveled-appearing man laying on stretcher, much more awake and alert this morning compared to admission  HENT: Abrasion on forehead Eyes: Pupils 2 mm Cardiovascular: Regular rate and rhythm, no murmurs rubs or gallops Pulmonary/Chest: Normal work of breathing satting 90-93% on 5L [place], rhonchorous throughout except for diminished breath sounds in the bilateral bases, tenderness to palpation over sternum  Assessment/Plan:  Active Problems:   Drug overdose, accidental or unintentional, initial encounter  Tyler Green is a 44 year old man who uses heroin intranasally with history of type 2 diabetes who presented to the ED after accidental heroin overdose and receiving 10 minutes of CPR. Found to have pulmonary embolism, aspiration pneumonia  vs aspiration pneumonitis, and COVID-19. This is hospital day #1.   Aspiration pneumonia vs aspiration pneumonitis Persistent cough with purulent sputum production. Less oxygen requirement today. High suspicion for aspiration pneumonia vs aspiration pneumonitis given his high risk for aspiration. Will treat with IV antibiotics, encourage use of incentive spirometer, and monitor for clinical improvement.  - Ampicillin-sulbactam (Unasyn) 3 g IV q6h (today is day 1) - Incentive spirometry - Continuous pulse ox; Wean O2 as tolerated - Duonebs q4h PRN   Pulmonary Embolism Found on CT chest PE protocol. Likely provoked in setting of COVID-19. Will treat with heparin as patient is at high risk for hemothorax with his multiple rib fractures.  - Heparin infusion    COVID-19 positive Persistent cough with purulent sputum production. Less oxygen requirement today. With concomitant aspiration pneumonia vs aspiration pneumonitis and pulmonary embolism, difficult to say whether these symptoms are truly secondary to his COVID infection. We will treat for symptomatic COVID-19 infection. - Decadron 6 mg daily x 5 days (today is day 2 of 5) - s/p Remdesivir 200 mg IV - Remdesivir 100 mg IV daily x 4 doses (today is dose 1 of 4) - Inflammatory labs pending   Multiple rib fractures Pain is well controlled on current regimen. Will monitor pain needs closely, especially in setting of patient's likely high tolerance for opioids. Will obtain daily CXR as the patient is at risk for developing hemothorax because of heparin and/or flail chest given the multiple fractures. - Pain control with              -  20 mg methadone BID             - IV Dilaudid 1 mg q4h PRN for persistent pain - Daily CXR   Opiate Use Disorder Patient reiterates today that he is highly motivated to obtain rehab services. Last use the day of admission (8/8). Given his current hospital problems, would like to help him avoid withdrawal for  maximum chance for healing/recovery. - Methadone 20 mg BID, low threshold to increase - 8/8 EKG with QTc of 486 - COWS - Will ask social work for resources available in the community   Type 2 Diabetes Patient reports he is prescribed metformin however states he only checks it intermittently. Hgb A1c on admission 5.8 - SSI  Tobacco Use Patient states he smokes ~1 ppd. Requests nicotine patch. - Nicotine patch 21 mg   Diet: Regular DVT ppx: Heparin   Tyler Lodge, MD 12/03/2019, 6:52 AM Pager: [phone] After 5pm on weekdays and 1pm on weekends: On Call pager [phone]

## 2019-12-03 NOTE — ED Notes (Signed)
Attending at bedside.

## 2019-12-04 LAB — GLUCOSE, CAPILLARY
Glucose-Capillary: 175 mg/dL — ABNORMAL HIGH (ref 70–99)
Glucose-Capillary: 152 mg/dL — ABNORMAL HIGH (ref 70–99)
Glucose-Capillary: 141 mg/dL — ABNORMAL HIGH (ref 70–99)
Glucose-Capillary: 208 mg/dL — ABNORMAL HIGH (ref 70–99)
Glucose-Capillary: 212 mg/dL — ABNORMAL HIGH (ref 70–99)

## 2019-12-04 LAB — CBC
HCT: 33.6 % — ABNORMAL LOW (ref 39.0–52.0)
Hemoglobin: 11.3 g/dL — ABNORMAL LOW (ref 13.0–17.0)
MCH: 29.8 pg (ref 26.0–34.0)
MCHC: 33.6 g/dL (ref 30.0–36.0)
MCV: 88.7 fL (ref 80.0–100.0)
Platelets: 188 10*3/uL (ref 150–400)
RBC: 3.79 MIL/uL — ABNORMAL LOW (ref 4.22–5.81)
RDW: 11.9 % (ref 11.5–15.5)
WBC: 8.9 10*3/uL (ref 4.0–10.5)
nRBC: 0 % (ref 0.0–0.2)

## 2019-12-04 LAB — HEPARIN LEVEL (UNFRACTIONATED)
Heparin Unfractionated: 0.39 IU/mL (ref 0.30–0.70)
Heparin Unfractionated: 0.34 IU/mL (ref 0.30–0.70)

## 2019-12-04 LAB — MRSA PCR SCREENING: MRSA by PCR: POSITIVE — AB

## 2019-12-04 MED ORDER — SODIUM CHLORIDE 0.9 % IV SOLN
INTRAVENOUS | Status: DC | PRN
  Administered 2019-12-04: 500 mL via INTRAVENOUS

## 2019-12-04 MED ORDER — AMOXICILLIN-POT CLAVULANATE 875-125 MG PO TABS
1.0000 | ORAL_TABLET | Freq: Two times a day (BID) | ORAL | Status: DC
  Administered 2019-12-04 – 2019-12-08 (×8): 1 via ORAL
  Filled 2019-12-04 (×8): qty 1

## 2019-12-04 MED ORDER — GUAIFENESIN ER 600 MG PO TB12
1200.0000 mg | ORAL_TABLET | Freq: Two times a day (BID) | ORAL | Status: DC
  Administered 2019-12-04 – 2019-12-10 (×13): 1200 mg via ORAL
  Filled 2019-12-04 (×13): qty 2

## 2019-12-04 MED ORDER — MUPIROCIN 2 % EX OINT
1.0000 "application " | TOPICAL_OINTMENT | Freq: Two times a day (BID) | CUTANEOUS | Status: AC
  Administered 2019-12-04 – 2019-12-08 (×10): 1 via NASAL
  Filled 2019-12-04 (×4): qty 22

## 2019-12-04 MED ORDER — METHADONE HCL 10 MG PO TABS
15.0000 mg | ORAL_TABLET | Freq: Two times a day (BID) | ORAL | Status: DC
  Administered 2019-12-04 – 2019-12-10 (×12): 15 mg via ORAL
  Filled 2019-12-04 (×12): qty 2

## 2019-12-04 MED ORDER — CHLORHEXIDINE GLUCONATE CLOTH 2 % EX PADS
6.0000 | MEDICATED_PAD | Freq: Every day | CUTANEOUS | Status: AC
  Administered 2019-12-04 – 2019-12-08 (×5): 6 via TOPICAL

## 2019-12-04 NOTE — Progress Notes (Signed)
ANTICOAGULATION CONSULT NOTE - Follow Up Consult  Pharmacy Consult for heparin Indication: pulmonary embolus  Labs: Recent Labs    12/02/19 1336 12/02/19 1336 12/02/19 1541 12/03/19 0601 12/03/19 1244 12/03/19 2130  HGB 13.0   < > 12.6* 12.8*  --   --   HCT 39.6  --  37.0* 39.3  --   --   PLT 221  --   --  208  --   --   HEPARINUNFRC  --   --   --  0.18* 0.22* 0.39  CREATININE 0.82  --   --  0.83  --   --    < > = values in this interval not displayed.    Assessment: 44yo male therapeutic on heparin after rate changes though at low end of goal, would prefer higher level w/ PE.  Goal of Therapy:  Heparin level 0.3-0.7 units/ml   Plan:  Will increase heparin gtt slightly to 1500 units/hr and check level in 6 hours.    Wynona Neat, PharmD, BCPS  12/04/2019,3:55 AM

## 2019-12-04 NOTE — ED Notes (Signed)
Phar marcy called to inform of Heparin change; ED RN will change prior to transport to floor-Monique,RN

## 2019-12-04 NOTE — Progress Notes (Signed)
CSW received request from patient for substance use resources. CSW spoke with patient. He requested outpatient resources so that he can still go to his job. CSW printed resources to the floor and requested RN deliver them to the patient.  Kalinda Romaniello LCSW

## 2019-12-04 NOTE — Progress Notes (Signed)
Subjective:   No acute events overnight.  Patient evaluated at bedside this morning, seems to be in good spirits. Reports his rib pain is being managed by his current pain regimen and requests that we decrease his methadone dose as he thinks it is making him feel drowsy. Tells [place] his cough is persistent and productive. Denies shortness of breath. Satting in the high 90s on 2L.  Objective:  Vital signs in last 24 hours: Vitals:   12/04/19 0200 12/04/19 0330 12/04/19 0419 12/04/19 0518  BP: (!) 154/92 (!) 168/92 (!) 167/90 (!) 165/91  Pulse: 63 (!) 57 (!) 59 (!) 59  Resp: 12 19 17 16   Temp:   97.7 F (36.5 C) 98.2 F (36.8 C)  TempSrc:   Oral Oral  SpO2: 100% 99% 100% 100%  Weight:    66.1 kg  Height:    5\' 8"  (1.727 m)   Physical Exam: Constitutional: Less tired-appearing man sitting upright in bed, awake and alert, very pleasant  HENT:Abrasion on forehead Eyes:Pupils 2 mm Cardiovascular:Regular rate and rhythm, no murmurs rubs or gallops Pulmonary/Chest: Normal work of breathing satting high 90s on 2L [name], improving air movement throughout except for diminished breath sounds in the bilateral bases, tenderness to palpation over sternum  Assessment/Plan:  Active Problems:   Drug overdose, accidental or unintentional, initial encounter  Tyler Green is a 44 year old man who uses heroin intranasally with history of type 2 diabetes who presented to the EDafter accidental heroin overdoseand receiving 10 minutes of CPR. Found to have pulmonary embolism, aspiration pneumonia vs aspiration pneumonitis, and COVID-19.This is hospital day #2.  Aspiration pneumonia vs aspiration pneumonitis Less oxygen requirement today, with saturations in the high 90s on 2L [name]. Persistent cough with purulent sputum production. Given continued clinical improvement, will switch IV Unasyn to PO Augmentin to finish out 5-day course. - Switch to PO Augmentin (today is day 3 of 5 abx therapy) - Incentive  spirometry - Schedule guaifenesin 1200mg  BID - Duonebs q4h PRN - Continuous pulse ox; Wean O2 as tolerated  Provoked pulmonary embolism Found on CT chest PE protocol. Likely provoked in setting of COVID-19. Will treat with heparin as patient is at high risk for hemothorax with his multiple rib fractures.  - Heparin infusion   COVID-19 positive Persistent cough with purulent sputum production. Less oxygen requirement today. With concomitant aspiration pneumonia vs aspiration pneumonitis and pulmonary embolism, difficult to say whether these symptoms are truly secondary to his COVID infection. We will treat for symptomatic COVID-19 infection. Inflammatory labs significant for CRP elevated at 8.2, fibrinogen elevated at 543, D-dimer elevated at 0.77. Ferritin and LDH wnl. - Decadron 6 mg daily x 5 days (today is day 3 of 5) - s/p Remdesivir 200 mg IV - Remdesivir 100 mg IV daily x 4 doses (today is dose 2 of 4)  Multiple rib fractures Patient requesting we decrease his current dose of methadone as he feels it is making him feel drowsy. Will monitor pain needs closely, especially in setting of patient's likely high tolerance for opioids. Will obtain daily CXR as the patient is at risk for developing hemothorax because of heparin and/or flail chest given the multiple fractures. - Pain control with  - Decrease methadone to 15 mg BID - IV Dilaudid 1 mg q4h PRN for persistent pain - Daily CXR  Opiate Use Disorder Patient denying any of his typical withdrawal symptoms. COWS have been 0. Patient reiterates today that he is highly motivated to obtain rehab services.  Last use the day of admission (8/8). Given his current hospital problems, would like to help him avoid withdrawal for maximum chance for healing/recovery. - Methadone 15 mg BID, low threshold to increase - 8/8 EKG with QTc of 486 - COWS - Outpatient rehab resources shared with patient by SW  Type 2  Diabetes Patient reports he is prescribed metformin however states he only checks it intermittently. Sugars have been 140s-200s, suspect elevated in part due to steroid. Hgb A1c on admission 5.8. - SSI  Tobacco Use - Nicotine patch 21 mg  Diet: Regular DVT ppx: Heparin  Tyler Lodge, MD 12/04/2019, 6:28 AM Pager: [phone] After 5pm on weekdays and 1pm on weekends: On Call pager [phone]

## 2019-12-04 NOTE — Plan of Care (Signed)

## 2019-12-04 NOTE — Progress Notes (Signed)
ANTICOAGULATION CONSULT NOTE   Pharmacy Consult for heparin Indication: pulmonary embolus  No Known Allergies  Patient Measurements: Heparin Dosing Weight: 68kg  Vital Signs: Temp: 98.2 F (36.8 C) (08/10 0817) Temp Source: Oral (08/10 0817) BP: 157/90 (08/10 0817) Pulse Rate: 60 (08/10 0817)  Labs: Recent Labs    12/02/19 1336 12/02/19 1336 12/02/19 1541 12/02/19 1541 12/03/19 0601 12/03/19 0601 12/03/19 1244 12/03/19 2130 12/04/19 0500 12/04/19 0929  HGB 13.0   < > 12.6*   < > 12.8*  --   --   --  11.3*  --   HCT 39.6   < > 37.0*  --  39.3  --   --   --  33.6*  --   PLT 221  --   --   --  208  --   --   --  188  --   HEPARINUNFRC  --   --   --   --  0.18*   < > 0.22* 0.39  --  0.34  CREATININE 0.82  --   --   --  0.83  --   --   --   --   --    < > = values in this interval not displayed.    Estimated Creatinine Clearance: 107.3 mL/min (by C-G formula based on SCr of 0.83 mg/dL).  Assessment: 28 YOM presenting as unresponsive, A&Ox4 s/p narcan. He continues on IV heparin for new PE.   Heparin level therapeutic with heparin drip infusing at 1500 units/hr. CBC is stable. No bleeding noted.   Goal of Therapy:  Heparin level 0.3-0.7 units/ml Monitor platelets by anticoagulation protocol: Yes   Plan:  Continue heparin infusion at 1500 units/hr Monitor daily heparin level, CBC, s/s bleeding  Thoma Paulsen P. Legrand Como, PharmD, [place] Please utilize Amion for appropriate phone number to reach the unit pharmacist ([place]) 12/04/2019 11:19 AM

## 2019-12-05 ENCOUNTER — Inpatient Hospital Stay (HOSPITAL_COMMUNITY): Payer: BC Managed Care – PPO

## 2019-12-05 LAB — GLUCOSE, CAPILLARY
Glucose-Capillary: 136 mg/dL — ABNORMAL HIGH (ref 70–99)
Glucose-Capillary: 161 mg/dL — ABNORMAL HIGH (ref 70–99)
Glucose-Capillary: 217 mg/dL — ABNORMAL HIGH (ref 70–99)
Glucose-Capillary: 294 mg/dL — ABNORMAL HIGH (ref 70–99)

## 2019-12-05 LAB — CBC
HCT: 36.5 % — ABNORMAL LOW (ref 39.0–52.0)
Hemoglobin: 12.4 g/dL — ABNORMAL LOW (ref 13.0–17.0)
MCH: 29.9 pg (ref 26.0–34.0)
MCHC: 34 g/dL (ref 30.0–36.0)
MCV: 88 fL (ref 80.0–100.0)
Platelets: 233 10*3/uL (ref 150–400)
RBC: 4.15 MIL/uL — ABNORMAL LOW (ref 4.22–5.81)
RDW: 11.8 % (ref 11.5–15.5)
WBC: 10.2 10*3/uL (ref 4.0–10.5)
nRBC: 0 % (ref 0.0–0.2)

## 2019-12-05 LAB — HEPARIN LEVEL (UNFRACTIONATED): Heparin Unfractionated: 0.46 IU/mL (ref 0.30–0.70)

## 2019-12-05 MED ORDER — SENNOSIDES-DOCUSATE SODIUM 8.6-50 MG PO TABS
2.0000 | ORAL_TABLET | Freq: Two times a day (BID) | ORAL | Status: DC
  Administered 2019-12-05 – 2019-12-10 (×9): 2 via ORAL
  Filled 2019-12-05 (×11): qty 2

## 2019-12-05 MED ORDER — RIVAROXABAN 15 MG PO TABS
15.0000 mg | ORAL_TABLET | Freq: Two times a day (BID) | ORAL | Status: DC
  Administered 2019-12-05 – 2019-12-10 (×10): 15 mg via ORAL
  Filled 2019-12-05 (×10): qty 1

## 2019-12-05 MED ORDER — POLYETHYLENE GLYCOL 3350 17 G PO PACK
17.0000 g | PACK | Freq: Every day | ORAL | Status: DC | PRN
  Filled 2019-12-05: qty 1

## 2019-12-05 MED ORDER — HEPARIN (PORCINE) 25000 UT/250ML-% IV SOLN
1500.0000 [IU]/h | INTRAVENOUS | Status: AC
  Administered 2019-12-05: 1500 [IU]/h via INTRAVENOUS

## 2019-12-05 MED ORDER — RIVAROXABAN 20 MG PO TABS: 20.0000 mg | ORAL_TABLET | Freq: Every day | ORAL | Status: DC

## 2019-12-05 NOTE — Progress Notes (Signed)
ANTICOAGULATION CONSULT NOTE   Pharmacy Consult for heparin Indication: pulmonary embolus  No Known Allergies  Patient Measurements: Heparin Dosing Weight: 68kg  Vital Signs: Temp: 97.9 F (36.6 C) (08/11 0746) Temp Source: Oral (08/11 0746) BP: 159/93 (08/11 0746) Pulse Rate: 50 (08/11 0400)  Labs: Recent Labs    12/02/19 1336 12/02/19 1541 12/03/19 0601 12/03/19 0601 12/03/19 1244 12/03/19 2130 12/04/19 0500 12/04/19 0929 12/05/19 0136  HGB 13.0   < > 12.8*   < >  --   --  11.3*  --  12.4*  HCT 39.6   < > 39.3  --   --   --  33.6*  --  36.5*  PLT 221   < > 208  --   --   --  188  --  233  HEPARINUNFRC  --   --  0.18*  --    < > 0.39  --  0.34 0.46  CREATININE 0.82  --  0.83  --   --   --   --   --   --    < > = values in this interval not displayed.    Estimated Creatinine Clearance: 107.3 mL/min (by C-G formula based on SCr of 0.83 mg/dL).  Assessment: 89 YOM presenting as unresponsive, A&Ox4 s/p narcan. He continues on IV heparin for new PE.   Heparin level therapeutic with heparin drip infusing at 1500 units/hr. CBC is stable. No bleeding noted.   Goal of Therapy:  Heparin level 0.3-0.7 units/ml Monitor platelets by anticoagulation protocol: Yes   Plan:  Continue heparin infusion at 1500 units/hr Monitor daily heparin level, CBC, s/s bleeding  [place] P. Legrand Como, PharmD, [place] Please utilize Amion for appropriate phone number to reach the unit pharmacist ([place]) 12/05/2019 8:20 AM

## 2019-12-05 NOTE — Progress Notes (Signed)
Subjective:   No acute events overnight.  Patient evaluated at bedside this morning. Brighter and more animated this morning. States that the pain from his rib fractures seems to be more tolerable. Reports he feels more awake since the methadone dose was decreased. Still coughing up green/yellow phlegm, but feels like it is moving more readily with the Mucinex. Denies symptoms of withdrawal. Reports he has not had a BM since admission, denies abdominal pain.  Objective:  Vital signs in last 24 hours: Vitals:   12/05/19 0400 12/05/19 0746 12/05/19 1147 12/05/19 1700  BP: (!) 158/99 (!) 159/93 (!) 163/91 (!) 142/78  Pulse: (!) 50 (!) 53 (!) 58 (!) 57  Resp: 18 15 18 12   Temp:  97.9 F (36.6 C) 98.1 F (36.7 C) 98.3 F (36.8 C)  TempSrc:  Oral Oral Oral  SpO2: 96% 98% 95% 95%  Weight:      Height:       Physical Exam: Constitutional: Awake and alert man sitting upright in bed, very pleasant HENT:Abrasion on forehead Cardiovascular:Regular rate andrhythm, no murmurs rubs or gallops Pulmonary/Chest: Normal work of breathing satting high 90s on 2L [place], improving air movement throughout except for diminished breath sounds in the bilateral bases, tenderness to palpation over sternum  Assessment/Plan:  Active Problems:   Drug overdose, accidental or unintentional, initial encounter  Tyler Green is a 44 year old man who uses heroin intranasally with history of type 2 diabetes who presented to the EDafter accidental heroin overdoseand receiving 10 minutes of CPR. Found to have pulmonary embolism, aspiration pneumonia vs aspiration pneumonitis, and COVID-19.This is hospital day #3.  Aspiration pneumonia vs aspiration pneumonitis Maintainng saturations in the high 90s on 2L [place]. Persistent cough with purulent sputum production. CXR today demonstrated that previously seen basilar opacities have resolved in the interval. - PO Augmentin(today is day 4 of 5 abx therapy) - Incentive  spirometry - guaifenesin 1200mg  BID - Duonebs q4h PRN - Continuous pulse ox; Wean O2 as tolerated  Provoked pulmonary embolism in setting of COVID-19 Found on CT chest PE protocol.Likely provoked in setting of COVID-19.Will transition to Xarelto as patient has been stable. - Xarelto   COVID-19 positive Persistent cough with purulent sputum production. Maintaining on minimal oxygen requirement .With concomitant aspiration pneumonia vs aspiration pneumonitis and pulmonary embolism, difficult to say whether these symptoms are truly secondary to his COVID infection. We will treat for symptomatic COVID-19 infection. - Decadron 6 mg daily x 5 days(today is day 4 of 5) - Remdesivirdaily x 5 doses (today is dose 3 of 5)  Multiple rib fractures Pain well controlled on reduced methadone dose. Will monitor pain needs closely, especially in setting of patient's likely high tolerance for opioids.Daily CXR as the patient is at risk for developing hemothorax because of heparin and/or flail chest given the multiple fractures. - Pain control with  - Decrease methadone to 15 mg BID - IV Dilaudid 1 mg q4h PRN for persistent pain - Daily CXR - Bowel regimen: senna-docusate 2 tablets BID  Opiate Use Disorder Patient denying any of his typical withdrawal symptoms. COWS have been 0. Patient highly motivated to obtain rehab services. Last use the day of admission (8/8).Given his current hospital problems, would like to help him avoid withdrawal for maximum chance for healing/recovery. -Methadone 15 mg BID,low threshold to increase - 8/8 EKG with QTc of 486 - COWS - Outpatient rehab resources shared with patient by SW  Type 2 Diabetes Patient reports he is prescribed metformin however states  he only checks it intermittently.Sugars have been 140s-200s, suspect elevated in part due to steroid. HgbA1con admission 5.8. - SSI  Tobacco Use - Nicotine patch 21 mg  Diet:  Regular DVT ppx: Heparin  [place]andria Lodge, MD 12/05/2019, 6:05 PM Pager: [phone] After 5pm on weekdays and 1pm on weekends: On Call pager [phone]

## 2019-12-06 ENCOUNTER — Inpatient Hospital Stay (HOSPITAL_COMMUNITY): Payer: BC Managed Care – PPO

## 2019-12-06 LAB — CBC
HCT: 37.4 % — ABNORMAL LOW (ref 39.0–52.0)
Hemoglobin: 12.8 g/dL — ABNORMAL LOW (ref 13.0–17.0)
MCH: 29.8 pg (ref 26.0–34.0)
MCHC: 34.2 g/dL (ref 30.0–36.0)
MCV: 87.2 fL (ref 80.0–100.0)
Platelets: 281 10*3/uL (ref 150–400)
RBC: 4.29 MIL/uL (ref 4.22–5.81)
RDW: 11.8 % (ref 11.5–15.5)
WBC: 11.4 10*3/uL — ABNORMAL HIGH (ref 4.0–10.5)
nRBC: 0 % (ref 0.0–0.2)

## 2019-12-06 LAB — GLUCOSE, CAPILLARY
Glucose-Capillary: 126 mg/dL — ABNORMAL HIGH (ref 70–99)
Glucose-Capillary: 191 mg/dL — ABNORMAL HIGH (ref 70–99)
Glucose-Capillary: 140 mg/dL — ABNORMAL HIGH (ref 70–99)
Glucose-Capillary: 279 mg/dL — ABNORMAL HIGH (ref 70–99)

## 2019-12-06 MED ORDER — ONDANSETRON HCL 4 MG/2ML IJ SOLN
4.0000 mg | Freq: Once | INTRAMUSCULAR | Status: AC
  Administered 2019-12-06: 4 mg via INTRAVENOUS
  Filled 2019-12-06: qty 2

## 2019-12-06 NOTE — Plan of Care (Signed)
  Problem: Education: Goal: Knowledge of General Education information will improve Description: Including pain rating scale, medication(s)/side effects and non-pharmacologic comfort measures Outcome: Progressing   Problem: Nutrition: Goal: Adequate nutrition will be maintained Outcome: Progressing Patient consumes >50% of meals.   Problem: Pain Managment: Goal: General experience of comfort will improve Outcome: Progressing  Patient states pain is currently controlle dwith scheduled methadone.

## 2019-12-06 NOTE — Progress Notes (Signed)
  Date: 12/06/2019  Patient name: Tyler Green  Medical record number: [id number]  Date of birth: 14-Jun-1975    Subjective: Tyler Green sat quite uncomfortably in the chair yesterday for a period of time then had a rough night, during which he had some vomiting ("I do not know why it happened, my stomach had not been bothering me", no blood) which significantly exacerbated his chest pain.  Today he feels much worse than yesterday, and questions if it is the Covid illness.  He cannot specify, but feels generalized malaise, no energy, no appetite, and is discouraged.  Objective:  Vital signs in last 24 hours: Vitals:   12/06/19 0025 12/06/19 0402 12/06/19 0739 12/06/19 1257  BP: (!) 160/84 (!) 147/86 (!) 157/95 (!) 155/93  Pulse: 60 (!) 53 (!) 55 (!) 55  Resp: 20 12 11 10   Temp: 97.8 F (36.6 C) 97.8 F (36.6 C) 98 F (36.7 C) 98 F (36.7 C)  TempSrc: Oral Oral Oral Oral  SpO2: 98% 98% (!) 20% 97%  Weight:      Height:        Physical Exam Lying still in the bed, awake but resting with his eyes closed, not as animated as he has been in the last couple of days, appears ill but in no acute distress.  Coughs frequently during encounter which sounds congested, though no sputum is expectorated.  Breathing is nonlabored, though breaths are somewhat shallow due to splinting.  Visual inspection of the chest reveals no erythema, swelling, or ecchymosis.  Breath sounds are distant, and no abnormal lung sounds are heard.  Abdomen is flat soft and nontender.  Skin is warm and dry with decreased turgor.  Assessment/Plan:  1.  Presumed aspiration pneumonia vs pneumonitis at admission; today is day 5/5 Augmentin.  Recent chest x-ray showed resolution of opacities. 2.  Covid 19 illness: today is day 5/5 decadron, day 4/5 remdesivir.  Worsened cough and chest pain as well as worsened generalized malaise: repeat CXR today (high risk of complications due to multiple rib fracture with ongoing  anticoagulation for PE).  Continue oxygen supplementation for hypoxic respiratory failure. 3.  PE, associated with Covid illness: transition from heparin to [place] yesterday was intention. 4.  Multiple rib fractures due to CPR chest trauma: Doing remarkably well, though vomiting surely jolted the healing fractures and pain has increased.  He will inform his nurse if increased methadone is needed.    5.  Unintended heroin overdose with out of facility arrest and successful resuscitation: no symptoms of withdrawal, very committed to rehab, supportive family.  Dispo: Anticipated discharge in approximately 5-7 day(s).   Tori Milks, MD 12/06/2019, 3:20 PM

## 2019-12-07 LAB — CBC
HCT: 39.7 % (ref 39.0–52.0)
Hemoglobin: 13.9 g/dL (ref 13.0–17.0)
MCH: 30.3 pg (ref 26.0–34.0)
MCHC: 35 g/dL (ref 30.0–36.0)
MCV: 86.7 fL (ref 80.0–100.0)
Platelets: 347 10*3/uL (ref 150–400)
RBC: 4.58 MIL/uL (ref 4.22–5.81)
RDW: 11.8 % (ref 11.5–15.5)
WBC: 9.8 10*3/uL (ref 4.0–10.5)
nRBC: 0 % (ref 0.0–0.2)

## 2019-12-07 LAB — GLUCOSE, CAPILLARY
Glucose-Capillary: 121 mg/dL — ABNORMAL HIGH (ref 70–99)
Glucose-Capillary: 110 mg/dL — ABNORMAL HIGH (ref 70–99)
Glucose-Capillary: 225 mg/dL — ABNORMAL HIGH (ref 70–99)
Glucose-Capillary: 135 mg/dL — ABNORMAL HIGH (ref 70–99)

## 2019-12-07 LAB — CULTURE, BLOOD (ROUTINE X 2)
Culture: NO GROWTH
Culture: NO GROWTH
Special Requests: ADEQUATE
Special Requests: ADEQUATE

## 2019-12-07 NOTE — Plan of Care (Signed)
  Problem: Pain Managment: Goal: General experience of comfort will improve Outcome: Not Progressing  Patient reports rib pain improved after methadone.  Problem: Activity: Goal: Risk for activity intolerance will decrease Outcome: Not Progressing   Problem: Pain Managment: Goal: General experience of comfort will improve Outcome: Not Progressing  Patient continues to refuse mobility/assistance to chair.

## 2019-12-07 NOTE — Plan of Care (Signed)
Discussed with patient plan of care for the evening, pain management and as need medications with some teach back displayed at this time. 

## 2019-12-07 NOTE — Discharge Instructions (Addendum)

## 2019-12-08 DIAGNOSIS — F1120 Opioid dependence, uncomplicated: Secondary | ICD-10-CM | POA: Insufficient documentation

## 2019-12-08 LAB — GLUCOSE, CAPILLARY
Glucose-Capillary: 118 mg/dL — ABNORMAL HIGH (ref 70–99)
Glucose-Capillary: 219 mg/dL — ABNORMAL HIGH (ref 70–99)
Glucose-Capillary: 86 mg/dL (ref 70–99)
Glucose-Capillary: 270 mg/dL — ABNORMAL HIGH (ref 70–99)

## 2019-12-08 LAB — CBC
HCT: 41.5 % (ref 39.0–52.0)
Hemoglobin: 13.9 g/dL (ref 13.0–17.0)
MCH: 29.6 pg (ref 26.0–34.0)
MCHC: 33.5 g/dL (ref 30.0–36.0)
MCV: 88.3 fL (ref 80.0–100.0)
Platelets: 353 10*3/uL (ref 150–400)
RBC: 4.7 MIL/uL (ref 4.22–5.81)
RDW: 11.9 % (ref 11.5–15.5)
WBC: 12.8 10*3/uL — ABNORMAL HIGH (ref 4.0–10.5)
nRBC: 0 % (ref 0.0–0.2)

## 2019-12-08 MED ORDER — DEXAMETHASONE 6 MG PO TABS
6.0000 mg | ORAL_TABLET | Freq: Every day | ORAL | Status: DC
  Administered 2019-12-08 – 2019-12-10 (×3): 6 mg via ORAL
  Filled 2019-12-08 (×3): qty 1

## 2019-12-08 NOTE — Evaluation (Signed)
Physical Therapy Evaluation & Discharge Patient Details Name: Tyler Green MRN: [id number] DOB: Jul 10, 1975 Today's Date: 12/08/2019   History of Present Illness  Pt is a 44 y.o. male admitted 12/02/19 after accidental heroin overdose and received CPR from bystanders; of note, seen in ED 8/5 after similar situation, received CPR from girlfriend sustaining several bilateral rib fxs. (+) COVID-19; unvaccinated. CT/angrio chest demonstrated PE, bilateral lower lobe consolidations, and multiple bilateral rib fx. PMH incudes heroin abuse, DM, chronic pain.    Clinical Impression  Patient evaluated by Physical Therapy with no further acute PT needs identified. PTA, pt independent, works, and lives with girlfriend. Today, pt able to mobilize well at supervision-level with assist for lines. SpO2 91-96% on RA with activity. Educ re: activity recommendations, importance of mobility. All education has been completed and the patient has no further questions. Acute PT is signing off. Thank you for this referral.    Follow Up Recommendations No PT follow up;Supervision - Intermittent    Equipment Recommendations  None recommended by PT    Recommendations for Other Services       Precautions / Restrictions Precautions Precautions: Fall Restrictions Weight Bearing Restrictions: No      Mobility  Bed Mobility Overal bed mobility: Modified Independent             General bed mobility comments: HOB elevated  Transfers Overall transfer level: Needs assistance Equipment used: None Transfers: Sit to/from Stand Sit to Stand: Supervision         General transfer comment: Able to stand from bed and low toilet height without assist  Ambulation/Gait Ambulation/Gait assistance: Min guard;Supervision Gait Distance (Feet): 80 Feet Assistive device: None Gait Pattern/deviations: Step-through pattern;Decreased stride length;Drifts right/left Gait velocity: Decreased   General Gait Details:  Slow, mildly unsteady gait without DME, initial min guard progressing to supervision for lines; pt declined hallway ambulation, agreeable to laps in room. Pulse ox probe replaced and SpO2 91-96% on RA. Pt limited by pain and fatigue  Stairs            Wheelchair Mobility    Modified Rankin (Stroke Patients Only)       Balance Overall balance assessment: Mild deficits observed, not formally tested                                           Pertinent Vitals/Pain Pain Assessment: No/denies pain    Home Living Family/patient expects to be discharged to:: Private residence Living Arrangements: Spouse/significant other (girlfriend) Available Help at Discharge: Friend(s);Available 24 hours/day (girlfriend) Type of Home: House Home Access: Stairs to enter Entrance Stairs-Rails: Right Entrance Stairs-Number of Steps: 4-5 Home Layout: Two level Home Equipment: Cane - single point;Wheelchair - manual Additional Comments: Lives with girlfriend who is on disability. DME from family    Prior Function Level of Independence: Independent         Comments: Works as Freight forwarder in [employer]        Extremity/Trunk Assessment   Upper Extremity Assessment Upper Extremity Assessment: Overall WFL for tasks assessed    Lower Extremity Assessment Lower Extremity Assessment: Overall WFL for tasks assessed       Communication   Communication: No difficulties  Cognition Arousal/Alertness: Awake/alert Behavior During Therapy: Flat affect Overall Cognitive Status: Within Functional Limits for tasks assessed  General Comments General comments (skin integrity, edema, etc.): Pt declined sitting in recliner because uncomfortable - stressed importance of upright activity, including sitting at EOB instead of staying supine    Exercises     Assessment/Plan    PT  Assessment Patent does not need any further PT services  PT Problem List         PT Treatment Interventions      PT Goals (Current goals can be found in the Care Plan section)  Acute Rehab PT Goals PT Goal Formulation: All assessment and education complete, DC therapy    Frequency     Barriers to discharge        Co-evaluation               AM-PAC PT "6 Clicks" Mobility  Outcome Measure Help needed turning from your back to your side while in a flat bed without using bedrails?: None Help needed moving from lying on your back to sitting on the side of a flat bed without using bedrails?: None Help needed moving to and from a bed to a chair (including a wheelchair)?: None Help needed standing up from a chair using your arms (e.g., wheelchair or bedside chair)?: None Help needed to walk in hospital room?: None Help needed climbing 3-5 steps with a railing? : A Little 6 Click Score: 23    End of Session   Activity Tolerance: Patient tolerated treatment well;Patient limited by fatigue;Patient limited by pain Patient left: in bed;with call bell/phone within reach;with bed alarm set Nurse Communication: Mobility status PT Visit Diagnosis: Other abnormalities of gait and mobility (R26.89)    Time: 6168-3729 PT Time Calculation (min) (ACUTE ONLY): 20 min   Charges:   PT Evaluation $PT Eval Moderate Complexity: 1 Mod     Mabeline Caras, PT, DPT Acute Rehabilitation Services  Pager [phone] Office [place] 12/08/2019, 11:31 AM

## 2019-12-08 NOTE — Progress Notes (Signed)
  Date: 12/08/2019  Patient name: Tyler Green  Medical record number: [id number]  Date of birth: 16-Feb-1976    Subjective: Pretty good day so far, though walked with physical therapy and noted a new pain in his right calf.  Continues to bother him after returning to the bed.  There had been no injury, no muscle spasm, and no change in the leg in appearance.  The cough continues to be more productive, which he takes as a sign of improvement.  He is not short of breath.  While the cough does jolt his broken ribs, he feels that the pain has been gradually improving.  He remains optimistic.  Objective:  Vital signs in last 24 hours: Vitals:   12/08/19 0303 12/08/19 0416 12/08/19 0807 12/08/19 1145  BP:  (!) 145/90 (!) 126/91 125/82  Pulse: 60 60 61 70  Resp: 19 14 16 18   Temp:  98.2 F (36.8 C) 98.5 F (36.9 C) 98.4 F (36.9 C)  TempSrc:  Oral Oral Oral  SpO2: 97% 97% 96% 96%  Weight:      Height:        Physical Exam: A bit more subdued in affect today compared to yesterday, though he is awake and alert and appropriately conversant.  Lungs with congested sounding cough, easy respirations, good airmovement, clear breath sounds otherwise.  Chest expands symmetrically.  No bruising over anterior chest wall and no visible deformities.  He continues to wince when he coughs. The anterior chest wall is tender in areas of rib fractures.  R calf, area of discomfort earlier today, has normal appearance and temperature.  No palpable abnormalities and no particular area of focal tenderness.  Dorseflexion of foot elicits minimal discomfort.  Foot is warm.  Knee normal.  Assessment/Plan:  Principal Problem:   Drug overdose, accidental or unintentional, initial encounter Active Problems:   Multiple rib fractures involving four or more ribs   Pulmonary embolus (HCC)   Pneumonia due to COVID-19 virus   Aspiration pneumonia (HCC)  Incremental improvement, with discharge anticipated early next  week.  He is not 02 requiring. No heroin withdrawal during hospitalization; prevention and pain management with methadone, tolerated well.  He has committed to changing his lifestyle and quitting heroin and outpatient rehab resources have been provided by SW (thank you).   PE initially treated with heparin (so that it could be stopped/reversed quickly if he were to develop bleeding complications from chest trauma). Transitioned to [place].  Completed antibiotic for aspiration pneumonia.  Completed remdesivir for Covid pneumonia. Order dexamethasone to complete steroid treatment.  I don't know what to make of his calf pain, he will continue to monitor and we'll recheck tomorrow.  A DVT wouldn't be likely in setting of ongoing anticoagulation.    Dispo: Anticipated discharge in approximately 2-3 day(s). Establish f/u in IM clinic.  Tori Milks 12/08/2019, 3:15 PM

## 2019-12-08 NOTE — Plan of Care (Signed)

## 2019-12-09 LAB — GLUCOSE, CAPILLARY
Glucose-Capillary: 135 mg/dL — ABNORMAL HIGH (ref 70–99)
Glucose-Capillary: 273 mg/dL — ABNORMAL HIGH (ref 70–99)
Glucose-Capillary: 291 mg/dL — ABNORMAL HIGH (ref 70–99)
Glucose-Capillary: 238 mg/dL — ABNORMAL HIGH (ref 70–99)

## 2019-12-09 NOTE — Progress Notes (Signed)
  Date: 12/09/2019  Patient name: Tyler Green  Medical record number: [id number]  Date of birth: 12-Dec-1975    Subjective: "I feel a little better today". Breathing easily.  Still coughing occasionally, though taking fuller breaths with less discomfort.  No new problems.  Has walked about in the room, no dizziness.  Objective:  Vital signs in last 24 hours: Vitals:   12/09/19 0350 12/09/19 0735 12/09/19 1219 12/09/19 1634  BP: 114/80 127/90 122/77 116/75  Pulse: 68 66  77  Resp: 14 18  13   Temp: 98.3 F (36.8 C)  98.5 F (36.9 C) 98.3 F (36.8 C)  TempSrc: Oral  Oral Oral  SpO2: 94% 95%  93%  Weight:      Height:        Physical Exam: Bright affect, appearing comfortable, speaking easily with strong voice, no cough during encounter.  No chest bruising or deformity related to rib fractures.  Lungs largely clear with good airflow.     Assessment/Plan:  Principal Problem:   Drug overdose, accidental or unintentional, initial encounter Active Problems:   Multiple rib fractures involving four or more ribs   Pulmonary embolus (HCC)   Pneumonia due to COVID-19 virus   Aspiration pneumonia [facility])  Mr. Ace should be ready for discharge tomorrow.  He will complete a few more days of dexamethasone. Should have a televisit for hospital f/u with IM clinic in about a week.  SHould be  finished with isolation on 12/23/19.  I've asked him to call the methadone clinic tomorrow morning to get an appt as soon after that as possible.      Dispo: Anticipated discharge in approximately 1 day Dorian Pod 12/09/2019, 6:42 PM

## 2019-12-10 ENCOUNTER — Telehealth: Payer: Self-pay

## 2019-12-10 LAB — GLUCOSE, CAPILLARY
Glucose-Capillary: 128 mg/dL — ABNORMAL HIGH (ref 70–99)
Glucose-Capillary: 180 mg/dL — ABNORMAL HIGH (ref 70–99)

## 2019-12-10 MED ORDER — NICOTINE 21 MG/24HR TD PT24: 21.0000 mg | MEDICATED_PATCH | Freq: Every day | TRANSDERMAL | 0 refills | Status: DC

## 2019-12-10 MED ORDER — RIVAROXABAN 15 MG PO TABS: 15.0000 mg | ORAL_TABLET | Freq: Two times a day (BID) | ORAL | 0 refills | Status: DC

## 2019-12-10 MED ORDER — DEXAMETHASONE 6 MG PO TABS: 6.0000 mg | ORAL_TABLET | Freq: Every day | ORAL | 0 refills | Status: AC

## 2019-12-10 MED ORDER — METHADONE HCL 5 MG PO TABS: 15.0000 mg | ORAL_TABLET | Freq: Two times a day (BID) | ORAL | 0 refills | Status: DC

## 2019-12-10 NOTE — Telephone Encounter (Signed)
Hospital TOC per Dr. Shon Baton, discharge 12/10/2019 appt 12/18/2019.

## 2019-12-10 NOTE — Discharge Summary (Signed)
Name: Tyler Green MRN: [id number] DOB: [birth year] 44 y.o. PCP: Patient, No Pcp Per  Date of Admission: 12/02/2019  1:19 PM Date of Discharge: 12/10/2019 Attending Physician: Dr. Dorian Pod  Discharge Diagnosis:  1. Pneumonia due to COVID-19 virus 2. Provoked pulmonary embolism in the setting of COVID-19 3. Multiple rib fractures due to CPR chest trauma 4. Unintended heroin overdose with out-of-facility arrest and successful resuscitation 5. Presumed aspiration pneumonia vs pneumonitis at admission  Discharge Medications: Allergies as of 12/10/2019   No Known Allergies     Medication List    TAKE these medications   dexamethasone 6 MG tablet Commonly known as: DECADRON Take 1 tablet (6 mg total) by mouth daily for 2 days. Start taking on: December 11, 2019 Notes to patient: Tomorrow, 12/11/19.   metFORMIN 1000 MG tablet Commonly known as: GLUCOPHAGE Take 1 tablet (1,000 mg total) by mouth 2 (two) times daily with a meal. Notes to patient: Tonight, 12/10/19.   methadone 5 MG tablet Commonly known as: DOLOPHINE Take 3 tablets (15 mg total) by mouth 2 (two) times daily. Notes to patient: Tonight, 12/10/19.   naproxen 500 MG tablet Commonly known as: NAPROSYN Take 1 tablet twice daily for rib pain. Notes to patient: Tonight, 12/10/19.   nicotine 21 mg/24hr patch Commonly known as: NICODERM CQ - dosed in mg/24 hours Place 1 patch (21 mg total) onto the skin daily. Start taking on: December 11, 2019 Notes to patient: Tomorrow, 12/11/19.   RANITIDINE HCL PO Take 1 tablet by mouth daily as needed (indigestion).   Rivaroxaban 15 MG Tabs tablet Commonly known as: XARELTO Take 1 tablet (15 mg total) by mouth 2 (two) times daily with a meal for 13 days. Start taking on: December 11, 2019 Notes to patient: Tonight, 12/10/19.       Disposition and follow-up:   Tyler Green was discharged from [facility] in Stable condition.  At the hospital follow  up visit please address:  1. Pneumonia due to COVID-19 virus:  - Tyler Green completed 8 days of steroid therapy while hospitalized. Please confirm that he completed the 10-day course after discharge - He had a mild cough at discharge and was recommended to use OTC cough medicine PRN. Please check-in regarding his symptoms. - His quarantine will end 12/23/19.  2. Provoked pulmonary embolism in the setting of COVID-19 - He was transitioned from heparin to Xarelto during this hospitalization and instructed to take 15 mg daily for 13 days to complete his 21-days. Please prescribe him the 20 mg daily dosing for the remainder of his treatment course (minimum 3 months).  3. Multiple rib fractures due to CPR chest trauma - Pain managed with methadone 15 mg BID. Please check-in and see how his pain is being managed/how he is healing.  4. Unintended heroin overdose with out-of-facility arrest and successful resuscitation - Discharged under the assumption he would seek treatment at [facility] on the first walk-in date available after his quarantine ends on 12/23/19. Verify that he plans to do this.  - He was also provided with several intensive outpatient therapy programs.  5. Diabetes mellitus - A1c Hgb A1c on admission 5.8. He had minimum sliding scale needs during this admission. His home metformin resumed at discharge, though patient reportedly did not take regularly.   6. Tobacco cessation - Patient tolerated nicotine patch well during hospitalization. Discharged home with prescription.   6.  Labs / imaging needed at time of follow-up: none  7.  Pending labs/ test needing follow-up: none  Follow-up Appointments:   [facility] telehealth within 1-week post-discharge - 9/1 or 9/2 walk-in at [place] by problem list:  Tyler Green is a 44 year old man who uses heroin intranasally with history of type 2 diabetes who presented to the [facility] ED on  8/8/21after accidental heroin overdoseand receiving 10 minutes of CPR. Found to have multiple rib fractures, COVID-19 pneumonia, pulmonary embolism, aspiration pneumonia vs aspiration pneumonitis.  Pneumonia due to COVID-19 virus Unvaccinated. Reported subjective fever/chills, productive cough, and shortness of breath starting one day prior to this admission. Oxygen requirement only as high as 2L during this admission. Weaned back to room air well before discharge. Completed 5 days of Remdesivir infusion. Discharged home with 2 days remaining in 10-day steroid course.  Provoked pulmonary embolism in the setting of COVID-19 Found on CT chest PE protocol. Presumed to be provoked in setting of COVID-19 pneumonia. Treated with heparin initially and transitioned to Xarelto with instructions to finish out 21-day course of 15 mg. Prescription coupon card provided at discharge. - Will need prescription for 20 mg daily dosing - Minimum 3 months total treatment  Multiple rib fractures due to CPR chest trauma Secondary to trauma from CPR performed on the day of this admission and two weeks ago after the first heroin overdose. Pain management vital for his recovery from his aspiration pneumonitis vs pneumonia. Treated with methadone. Daily CXR obtained as the patient was at risk for developing hemothorax because of heparin and/or flail chest given the multiple fractures. No complications arose while admitted.  Unintended heroin overdose with out-of-facility arrest and successful resuscitation Reports a near daily use of heroin for the last 2-3 years, last use the morning of this admission. Tried methadone as recently as yesterday. Given his current hospital problems, would like to help him avoid withdrawal for maximum chance for healing/recovery.  Presumed aspiration pneumonia vs pneumonitis at admission Presented with productive cough and shortness of breath and found to have a new oxygen requirement in the  setting of two recent heroin overdoses which involved CPR (once two weeks prior to admission and once the day of admission). CT demonstrated bilateral lower lobe consolidations. High suspicion for aspiration pneumonia vs aspiration pneumonitis given his high risk for aspiration. Treated with 5-day course of antibiotics (IV Unasyn --> PO Augmentin).  Type 2 Diabetes Mellitus Hgb A1c on admission 5.8. He had minimum sliding scale needs during this admission. Elevations in blood sugars suspected in part due to steroid therapy. His home metformin resumed at discharge, though patient reportedly did not take regularly.   Tobacco cessation Patient requested nicotine patch 21 mg. Also prescribed on discharge.  Discharge Vitals:   BP 131/86 (BP Location: Right Arm)   Pulse 72   Temp 98 F (36.7 C) (Oral)   Resp 12   Ht 5\' 8"  (1.727 m)   Wt 66.1 kg   SpO2 95%   BMI 22.16 kg/m   Pertinent Labs, Studies, and Procedures:   CTA Chest PE protocol:  IMPRESSION: 1. Small amount of thrombus within the RIGHT main pulmonary artery, extending into the RIGHT LOWER lobe segmental branches. No evidence for RIGHT heart strain. 2. Bilateral LOWER lobe consolidation, consistent with infectious infiltrate, infarcts, or contusions. Given the presence of debris within the bronchi of the lingula and LEFT LOWER lobe, consider aspiration pneumonia. 3. Numerous bilateral acute rib fractures. Acute fractures of RIGHT ribs 2-7. Acute fractures of the LEFT ribs  2-7. No pneumothorax. 4. Thickening of the UPPER esophageal wall, consistent with esophagitis.  Discharge Instructions: Discharge Instructions    Call MD for:  difficulty breathing, headache or visual disturbances   Complete by: As directed    Call MD for:  extreme fatigue   Complete by: As directed    Call MD for:  persistant dizziness or light-headedness   Complete by: As directed    Call MD for:  severe uncontrolled pain   Complete by: As directed     Discharge instructions   Complete by: As directed    It was a pleasure taking care of you, Tyler Green. While you were in the hospital, we treated you for pneumonia, COVID-19, blood clots in your lungs which we suspect were due to COVID-19, pain secondary to rib fractures, and opioid use disorder. You completed a 5-day course of antibiotics for the pneumonia. Organized below you will find a list of instructions for after your discharge from the hospital. We will have someone from our clinic call you to schedule a telehealth appointment within the next week.  1) Pulmonary embolism (blood clot in the lungs) secondary to COVID-19: You were initiated on a blood thinner called Xarelto. You will continue to take this medicine for 13 days. When you follow-up with your new primary care doctor, they will prescribe you the rest of the therapy (3 months total).  2) COVID-19: You completed your Remdesivir infusion here in the hospital. You also started a course of steroids, and you will need to finish the two days of this therapy at home. Your cough may continue for a while, but it should get better over time.  3) Pain/opiate use disorder: You were treated with methadone 15 mg twice daily. We have prescribed you enough of this medicine to ensure that you will have enough until you can make it to [facility] on either 9/1 or 9/2.      Signed: Alexandria Lodge, MD 12/10/2019, 9:26 PM   Pager: [phone]

## 2019-12-10 NOTE — TOC Transition Note (Signed)
Transition of Care [facility]) - CM/SW Discharge Note   Patient Details  Name: Tyler Green MRN: [id number] Date of Birth: 1975/12/23  Transition of Care [facility]) CM/SW Contact:  Verdell Carmine, RN Phone Number: 12/10/2019, 3:44 PM   Clinical Narrative:    Jennye Moccasin card given to patient from [place] CSW   Final next level of care: Home/Self Care Barriers to Discharge: No Barriers Identified   Patient Goals and CMS Choice        Discharge Placement                       Discharge Plan and Services                                     Social Determinants of Health (SDOH) Interventions     Readmission Risk Interventions No flowsheet data found.

## 2019-12-10 NOTE — Progress Notes (Signed)
CSW tubed Xarelto card to Nursing station at request of RNCM and made RN aware.   Sharece Fleischhacker LCSW

## 2019-12-10 NOTE — Progress Notes (Signed)
   Subjective:   No acute events overnight.  Evaluated during morning rounds. Reports he is continuing to feel better every day. Specifically, he reports he has less pain with deep breathing and is feeling more energized. His significant other was on speaker phone as we discussed the plan for discharge today pending him scheduling follow-up at a methadone clinic.   Patient's significant other called and found out [facility] (patient's preference) only takes walk-ins on Wednesday and Thursday mornings. So, patient will plan on following up there on 12/26/19, which is the first Wednesday after his quarantine ends on 8/29.  Objective:  Vital signs in last 24 hours: Vitals:   12/09/19 1634 12/09/19 2022 12/10/19 0003 12/10/19 0442  BP: 116/75 116/69 125/74 129/78  Pulse: 77 74 67 60  Resp: 13 14 14 14   Temp: 98.3 F (36.8 C) 98.4 F (36.9 C) 98.4 F (36.9 C) 97.9 F (36.6 C)  TempSrc: Oral Oral Oral Oral  SpO2: 93% 93% 94% 95%  Weight:      Height:       Physical Exam: Constitutional: Appearing comfortable, speaking easily with strong voice, coughed only once during lung auscultation.  CV/Chest: RRR, no murmurs, rubs, or gallops. No chest bruising or deformity related to rib fractures.   Pulmonary: Lungs largely clear with good airflow, mildly diminished at the bases.  Assessment/Plan:  Principal Problem:   Drug overdose, accidental or unintentional, initial encounter Active Problems:   Pulmonary embolus (HCC)   Pneumonia due to COVID-19 virus   Multiple rib fractures involving four or more ribs   Aspiration pneumonia [facility])  Tyler Green is a 44 year old man who uses heroin intranasally with history of type 2 diabetes who presented to the EDafter accidental heroin overdoseand receiving 10 minutes of CPR. Found to have pulmonary embolism, aspiration pneumonia vs aspiration pneumonitis, and COVID-19.This is hospital day #8. He is stable for discharge home with close  tele-health follow-up with plan to visit [facility] for first walk-in available after his quarantine ends.    COVID-19 illness S/p Remdesivir treatment and 8 days of steroids. Patient discharged home with two remaining doses of Decadron.  Provoked pulmonary embolism in the setting of COVID-19 Transitioned from heparin to Xarelto. Will d/c home with Xarelto and close PCP follow-up to complete 71-month course of anticoagulation.  Multiple rib fractures due to CPR chest trauma Pain managed with methadone 15 mg BID. Patient will discharge on this regimen and follow-up at [facility] clinic for the first walk-in after his quarantine ends.  Unintented heroin overdose with out of facility arrest and successful resuscitation No symptoms of withdrawal during this hospitalization. Committed to rehab. Will follow-up at [facility] clinic for the first walk-in after his quarantine ends.  Presumed aspiration pneumonia vs pneumonitis at admission S/p 5 day treatment with Augment. Recent CXR showed resolution of opacities.   Alexandria Lodge, MD 12/10/2019, 6:41 AM Pager: [phone] After 5pm on weekdays and 1pm on weekends: On Call pager [phone]

## 2019-12-10 NOTE — Progress Notes (Signed)
Spoke with Significant other Janett Billow, she states that they would like to use [facility] for methadone management outpatient.  Location is [address].

## 2019-12-10 NOTE — Progress Notes (Signed)
Nsg Discharge Note  Admit Date:  12/02/2019 Discharge date: 12/10/2019   Everlene Other to be D/C'd Home per MD order.  AVS completed.  Copy for chart, and copy for patient signed, and dated. Patient/caregiver able to verbalize understanding.  Discharge Medication: Allergies as of 12/10/2019   No Known Allergies     Medication List    TAKE these medications   dexamethasone 6 MG tablet Commonly known as: DECADRON Take 1 tablet (6 mg total) by mouth daily for 2 days. Start taking on: December 11, 2019 Notes to patient: Tomorrow, 12/11/19.   metFORMIN 1000 MG tablet Commonly known as: GLUCOPHAGE Take 1 tablet (1,000 mg total) by mouth 2 (two) times daily with a meal. Notes to patient: Tonight, 12/10/19.   methadone 5 MG tablet Commonly known as: DOLOPHINE Take 3 tablets (15 mg total) by mouth 2 (two) times daily. Notes to patient: Tonight, 12/10/19.   naproxen 500 MG tablet Commonly known as: NAPROSYN Take 1 tablet twice daily for rib pain. Notes to patient: Tonight, 12/10/19.   nicotine 21 mg/24hr patch Commonly known as: NICODERM CQ - dosed in mg/24 hours Place 1 patch (21 mg total) onto the skin daily. Start taking on: December 11, 2019 Notes to patient: Tomorrow, 12/11/19.   RANITIDINE HCL PO Take 1 tablet by mouth daily as needed (indigestion).   Rivaroxaban 15 MG Tabs tablet Commonly known as: XARELTO Take 1 tablet (15 mg total) by mouth 2 (two) times daily with a meal for 13 days. Start taking on: December 11, 2019 Notes to patient: Tonight, 12/10/19.       Discharge Assessment: Vitals:   12/10/19 0745 12/10/19 1210  BP: (!) 138/91 131/86  Pulse: 60 72  Resp: 12   Temp: 98.3 F (36.8 C) 98 F (36.7 C)  SpO2: 94% 95%   Skin clean, dry and intact without evidence of skin break down, no evidence of skin tears noted. IV catheter discontinued intact. Site without signs and symptoms of complications - no redness or edema noted at insertion site, patient denies c/o  pain - only slight tenderness at site.  Dressing with slight pressure applied.  D/c Instructions-Education: Discharge instructions given to patient/family with verbalized understanding. D/c education completed with patient/family including follow up instructions, medication list, d/c activities limitations if indicated, with other d/c instructions as indicated by MD - patient able to verbalize understanding, all questions fully answered. Patient instructed to return to ED, call 911, or call MD for any changes in condition.  Patient escorted via [place], and D/C home via private auto.  Joni Reining, RN 12/10/2019 5:21 PM

## 2019-12-10 NOTE — Plan of Care (Signed)

## 2019-12-10 NOTE — Plan of Care (Signed)
  Problem: Education: Goal: Knowledge of General Education information will improve Description: Including pain rating scale, medication(s)/side effects and non-pharmacologic comfort measures 12/10/2019 1719 by Joni Reining, RN Outcome: Adequate for Discharge 12/10/2019 1147 by Joni Reining, RN Outcome: Progressing   Problem: Health Behavior/Discharge Planning: Goal: Ability to manage health-related needs will improve 12/10/2019 1719 by Joni Reining, RN Outcome: Adequate for Discharge 12/10/2019 1147 by Joni Reining, RN Outcome: Progressing   Problem: Clinical Measurements: Goal: Ability to maintain clinical measurements within normal limits will improve Outcome: Adequate for Discharge Goal: Will remain free from infection Outcome: Adequate for Discharge Goal: Diagnostic test results will improve Outcome: Adequate for Discharge Goal: Respiratory complications will improve Outcome: Adequate for Discharge Goal: Cardiovascular complication will be avoided Outcome: Adequate for Discharge   Problem: Activity: Goal: Risk for activity intolerance will decrease Outcome: Adequate for Discharge   Problem: Nutrition: Goal: Adequate nutrition will be maintained 12/10/2019 1719 by Joni Reining, RN Outcome: Adequate for Discharge 12/10/2019 1147 by Joni Reining, RN Outcome: Progressing   Problem: Coping: Goal: Level of anxiety will decrease Outcome: Adequate for Discharge   Problem: Elimination: Goal: Will not experience complications related to bowel motility Outcome: Adequate for Discharge Goal: Will not experience complications related to urinary retention Outcome: Adequate for Discharge   Problem: Pain Managment: Goal: General experience of comfort will improve Outcome: Adequate for Discharge Patient reports pain is controlled with scheduled medications.   Problem: Safety: Goal: Ability to remain free from injury will improve Outcome: Adequate for Discharge   Patient remains fall free. Problem: Skin Integrity: Goal: Risk for impaired skin integrity will decrease Outcome: Adequate for Discharge  No new areas of skin injury noted.

## 2019-12-10 NOTE — Plan of Care (Signed)
  Problem: Education: Goal: Knowledge of General Education information will improve Description: Including pain rating scale, medication(s)/side effects and non-pharmacologic comfort measures Outcome: Progressing   Problem: Health Behavior/Discharge Planning: Goal: Ability to manage health-related needs will improve Outcome: Progressing  Patient verbalizes awareness that he can continue with methadone outpatient, advised patient to set up appointment at clinic before he leaves. Problem: Nutrition: Goal: Adequate nutrition will be maintained Outcome: Progressing  Patient consumes >75% of meals.

## 2019-12-11 ENCOUNTER — Other Ambulatory Visit: Payer: Self-pay | Admitting: Internal Medicine

## 2019-12-11 DIAGNOSIS — S2249XA Multiple fractures of ribs, unspecified side, initial encounter for closed fracture: Secondary | ICD-10-CM

## 2019-12-11 MED ORDER — METHADONE HCL 10 MG PO TABS: 15.0000 mg | ORAL_TABLET | Freq: Two times a day (BID) | ORAL | 0 refills | Status: DC

## 2019-12-11 MED ORDER — DOLOPHINE 10 MG PO TABS: 15.0000 mg | ORAL_TABLET | Freq: Two times a day (BID) | ORAL | 0 refills | Status: DC

## 2019-12-13 NOTE — Telephone Encounter (Signed)
Agree, thank you

## 2019-12-13 NOTE — Telephone Encounter (Signed)
Transition Care Management Follow-up Telephone Call  Date of discharge and from where: 12/10/19 from [facility]  How have you been since you were released from the hospital? "I've been feeling pretty good"  Any questions or concerns? Yes, "I only received 13 days of Xarelto, am I going to need more"?  RN informed patient that at his [place] visit, MD will advise patient if he would like him to continue medication.  Patient also states he has no PCP and would like to establish care w/ [facility].   Items Reviewed:  Did the pt receive and understand the discharge instructions provided? Yes   Medications obtained and verified? Yes   Any new allergies since your discharge? No   Dietary orders reviewed? Yes  Do you have support at home? Yes   Functional Questionnaire: (I = Independent and D = Dependent) ADLs: I  Bathing/Dressing- I  Meal Prep- I  Eating- I  Maintaining continence- I  Transferring/Ambulation- I  Managing Meds- I  Follow up appointments reviewed:   PCP Hospital f/u appt confirmed? Yes  Scheduled to see Dr. Maryln Gottron team on 12/18/19 @ 0845 (this was changed to teleheath appt per [address] r/t Covid positive patients discharged from hospital should not enter hospital 21 days from onset of symptoms or test date. Test date 12/02/19, pt unable to confirm symptom onset). Patient notified appointment will be via telehealth and he verbalized understanding.  [facility] f/u appt confirmed? N/A  Are transportation arrangements needed? No   If their condition worsens, is the pt aware to call PCP or go to the Emergency Dept.? Yes  Was the patient provided with contact information for the PCP's office or ED? Yes  Was to pt encouraged to call back with questions or concerns? Yes

## 2019-12-17 NOTE — Progress Notes (Signed)
  [facility] Internal Medicine Residency Telephone Encounter Continuity Care Appointment  HPI:   This telephone encounter was created for Mr. Tyler Green on 12/18/2019 for the following purpose/cc hospital follow up from heroin overdose, COVID-19 infection, aspiration pneumonia, rib fracture.   Past Medical History:  Past Medical History:  Diagnosis Date  . Back pain   . Diabetes mellitus   . 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 Heroin abuse ([place])   . IV drug abuse ([place])   . Non compliance w medication regimen      Past Surgical History:  Procedure Laterality Date  . brain tumor removal    . INNER EAR SURGERY      Family History  Problem Relation Age of Onset  . Diabetes Mother   . Diabetes Father   . Arrhythmia Father     Social History   Tobacco Use  . Smoking status: Current Some Day Smoker    Packs/day: 1.00    Years: 30.00    Pack years: 30.00    Types: Cigarettes  . Smokeless tobacco: Never Used  Substance Use Topics  . Alcohol use: Not Currently    Alcohol/week: 0.0 standard drinks    Comment: formerly heavy drinker  . Drug use: Not Currently    Comment: Heroin use formerly       ROS:  Review of Systems  Constitutional: Negative for chills and fever.  HENT: Positive for congestion, ear discharge (chronic) and hearing loss (chronic). Negative for sore throat.   Eyes: Negative for blurred vision and double vision.  Respiratory: Positive for cough and sputum production. Negative for shortness of breath.   Cardiovascular: Positive for chest pain (from broken ribs) and orthopnea.  Gastrointestinal: Positive for diarrhea (after metformin use). Negative for abdominal pain, nausea and vomiting.  Genitourinary: Negative for dysuria and frequency.  Musculoskeletal: Positive for myalgias.  Skin: Negative for itching and rash.  Neurological: Negative for dizziness and headaches.       Assessment / Plan / Recommendations:   Please see A&P under problem  oriented charting for assessment of the patient's acute and chronic medical conditions.   As always, pt is advised that if symptoms worsen or new symptoms arise, they should go to an urgent care facility or to to ER for further evaluation.   Consent and Medical Decision Making:   Patient seen with Dr. Evette Doffing  This is a telephone encounter between Everlene Other and Andrew Au on 12/18/2019 for hospital f/u from heroin overdose and COVID-19 infection. The visit was conducted with the patient located at home and Andrew Au at [facility]. The patient's identity was confirmed using their DOB and current address. The patient has consented to being evaluated through a telephone encounter and understands the associated risks (an examination cannot be done and the patient may need to come in for an appointment) / benefits (allows the patient to remain at home, decreasing exposure to coronavirus). I personally spent 45 minutes on medical discussion.

## 2019-12-18 ENCOUNTER — Encounter: Payer: Self-pay | Admitting: Student

## 2019-12-18 ENCOUNTER — Ambulatory Visit (INDEPENDENT_AMBULATORY_CARE_PROVIDER_SITE_OTHER): Payer: BC Managed Care – PPO | Admitting: Student

## 2019-12-18 ENCOUNTER — Other Ambulatory Visit: Payer: Self-pay

## 2019-12-18 DIAGNOSIS — J690 Pneumonitis due to inhalation of food and vomit: Secondary | ICD-10-CM

## 2019-12-18 DIAGNOSIS — N529 Male erectile dysfunction, unspecified: Secondary | ICD-10-CM

## 2019-12-18 DIAGNOSIS — G4730 Sleep apnea, unspecified: Secondary | ICD-10-CM

## 2019-12-18 DIAGNOSIS — Z9889 Other specified postprocedural states: Secondary | ICD-10-CM

## 2019-12-18 DIAGNOSIS — U071 COVID-19: Secondary | ICD-10-CM | POA: Diagnosis not present

## 2019-12-18 DIAGNOSIS — I2693 Single subsegmental pulmonary embolism without acute cor pulmonale: Secondary | ICD-10-CM

## 2019-12-18 DIAGNOSIS — H7010 Chronic mastoiditis, unspecified ear: Secondary | ICD-10-CM | POA: Insufficient documentation

## 2019-12-18 DIAGNOSIS — F17200 Nicotine dependence, unspecified, uncomplicated: Secondary | ICD-10-CM

## 2019-12-18 DIAGNOSIS — S2249XA Multiple fractures of ribs, unspecified side, initial encounter for closed fracture: Secondary | ICD-10-CM

## 2019-12-18 DIAGNOSIS — E1165 Type 2 diabetes mellitus with hyperglycemia: Secondary | ICD-10-CM

## 2019-12-18 DIAGNOSIS — J1282 Pneumonia due to coronavirus disease 2019: Secondary | ICD-10-CM

## 2019-12-18 DIAGNOSIS — F1120 Opioid dependence, uncomplicated: Secondary | ICD-10-CM

## 2019-12-18 MED ORDER — RIVAROXABAN 20 MG PO TABS: 20.0000 mg | ORAL_TABLET | Freq: Every day | ORAL | 0 refills | Status: DC

## 2019-12-18 MED ORDER — METHADONE HCL 10 MG PO TABS: 20.0000 mg | ORAL_TABLET | Freq: Two times a day (BID) | ORAL | 0 refills | Status: AC

## 2019-12-18 NOTE — Assessment & Plan Note (Signed)
Reports that nicotine patches prescribed to him while hospitalized were helpful. He is agreeable to continuing this for now,  -continue nicotine patches

## 2019-12-18 NOTE — Assessment & Plan Note (Signed)
Reports he was taking his metformin (prescribed 1000mg  BID) roughly 4 times a day prior to hospitalization. Hgb A1c of 5.8. He was discharged on his previosu metformin dose which he was been compliant with, complains of some diarrhea.  -given benign A1c with minimal metformin use, discontinuing it at this time -follow up A1c in 3 months

## 2019-12-18 NOTE — Patient Instructions (Signed)
Thank you for allowing [place] to be a part of your care today, it was pleasure seeing you. We discussed your COVID-19 infection, aspiration pneumonia, rib fractures, pulmonary embolism, opioid use disorder, tobacco use, erectile dysfunction, diabetes, sleep disturbance, and history of ear surgery  For your COVID-19 infection, aspiration pneumonia, and rib fractures, please continue using your inventive spirometer. I am also increasing your methadone dose to 20mg  (2 tablets twice a day). There should be enough in your prescription to last until you are seen at [facility]. Please call us if you have any further issues or uncontrolled pain. You should continue quarantining until 8/29.  For your pulmonary emboslim, continue your Xarelto 15mg  twice a day until it completes on 8/30. At that point, please start taking 20mg  once a day which I have prescribed. You will take this for 90 days in total.  For your opioid use disorder, please continue methadone at 20mg  twice a day as discussed above, and seek care at [facility] when your COVID-19 quarantin ends on 8/29. Please feel free to call us if you are having any issues.  For your tobacco use, continue using your nicotine patches to decrease the craving for cigarettes.  For your diabetes, we are stopping your metformin at this time because your last A1c was quite good.  For your erectile dysfunction, I am unable to properly evaluate this over the phone. We will address this further at your next visit.  I have referred you to an ENT for your history of ear surgery with malodorous discharge, and I have referred you for a sleep study for your sleep disturbance.  I have made these changes to your medications: Increase methadone to 20mg  twice a day  Stop metformin Prescribed Xarelto 20mg  daily to take after you complete your current prescription of 15mg  twice daily, to complete on 8/30  Please follow up in 2 weeks   Thank you, and please call the Internal  Medicine Clinic at [phone] if you have any questions.  Best, Dr. Bridgett Larsson

## 2019-12-18 NOTE — Assessment & Plan Note (Addendum)
Had multiple ribs fractures during CPR required for opioid overdose. This was treated with 15mg  methadone BID while hospitalized. He states that the pain has increased since discharge because he is more active at home. Good management of his pain is essential given recent COVID-19 infection and aspiration pneumonia.   -increase dose to 20mg  BID     -he should have enough tablets prescribed from the hospital to last until roughly 9/8     -he intends to seek treatment at [facility] methadone clinic for his opioid use disorder     -follow up in 2 weeks to ensure he is able to obtain prescription through [facility] or other methadone clinic -reinforced use of incentive spirometer to prevent pneumonia

## 2019-12-18 NOTE — Assessment & Plan Note (Signed)
At time of discharge had much improved CXR. Finished course of Abx during hospital stay. No recent symptoms of fevers, chills, increased sputum, or worsening SOB. Still having some SOB, compounded by rib Fx secondary to CPR.   -reinforced incentive spirometer use -instructed to call back for increasing respiratory or infectious symptoms

## 2019-12-18 NOTE — Assessment & Plan Note (Addendum)
Reports history of erectile dysfunction. Unable to assess fully through telephone visit. Has tried viagra in the past without great success.  -physical exam at next in-person encounter -consider the following labs at next in-person encounter: CMP, TSH, lipid profile, serum testosterone -smoking cessation may also be helpful

## 2019-12-18 NOTE — Assessment & Plan Note (Signed)
Patient reports snoring and pauses in breathing throughout the night, states that he wakes unrested. Has never had a sleep study. Also complains of insomnia with frequent nighttime awakenings.  -referral for sleep study

## 2019-12-18 NOTE — Assessment & Plan Note (Signed)
Patient reports compliance with Xarelto 15mg  BID, to finish his initial 21 day course of 8/30. No increased SOB since returning home, no new CP aside from pain from rib fractures.   -prescribed 3 months of 20mg  daily dosing to start after he finishes his current prescription, patient understands

## 2019-12-18 NOTE — Assessment & Plan Note (Signed)
Patient is recovering from heroin use disorder. States that he has not used any since his hospital discharge, and at this time is very motivated to quit. He is on methadone 15mg  BID right now for this and concurrent rib fractures, we are increasing this dose to 20mg  BID. He wants to seek treatment at [facility] methadone clinic on the first walk-in date available after his quarantine ends on 8/29.   -methadone 20mg  BID, he should have enough tablets from his hospital prescription to last until he can get in with [facility] -agree with patient plan to seek treatment at [facility] -follow up in 2 weeks to ensure he has enough of his methadone prescription, and provide further resources on opioid use disorder treatment if necessary

## 2019-12-18 NOTE — Assessment & Plan Note (Signed)
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 States he was seeing an ENT every 2 years to treat this, but last saw one 5 years ago.  -referral to ENT -unable to assess today since it was a phone clinic, assess his ear at next in-person appointment

## 2019-12-18 NOTE — Assessment & Plan Note (Addendum)
Patient states that SOB and coughing have improved progressively. No recent fevers, chills, n/v. Still having some difficulty breathing which is compounded by rib fractures sustained during CPR for opioid overdose. Also still having loss of taste and mild body aches. Currently quarantining until 8/29. Patient also developed PE during recent hospitalization, considered provoked due to [place]. Compliant with Xarelto prescription.  -reinforced incentive spirometer use -discussed supportive measures including hydration and rest

## 2019-12-19 NOTE — Progress Notes (Signed)
Internal Medicine Clinic Attending  I spoke with the patient by phone.  I personally confirmed the key portions of the history documented by Dr. Bridgett Larsson and I reviewed pertinent patient test results.  The assessment, diagnosis, and plan were formulated together and I agree with the documentation in the resident's note.

## 2020-01-01 ENCOUNTER — Encounter: Payer: Self-pay | Admitting: *Deleted

## 2020-01-11 ENCOUNTER — Ambulatory Visit (INDEPENDENT_AMBULATORY_CARE_PROVIDER_SITE_OTHER): Payer: BC Managed Care – PPO | Admitting: Internal Medicine

## 2020-01-11 VITALS — BP 141/86 | HR 76 | Wt 165.0 lb

## 2020-01-11 DIAGNOSIS — I2699 Other pulmonary embolism without acute cor pulmonale: Secondary | ICD-10-CM

## 2020-01-11 DIAGNOSIS — R7303 Prediabetes: Secondary | ICD-10-CM

## 2020-01-11 DIAGNOSIS — I10 Essential (primary) hypertension: Secondary | ICD-10-CM

## 2020-01-11 DIAGNOSIS — J1282 Pneumonia due to coronavirus disease 2019: Secondary | ICD-10-CM

## 2020-01-11 DIAGNOSIS — F1121 Opioid dependence, in remission: Secondary | ICD-10-CM | POA: Diagnosis not present

## 2020-01-11 DIAGNOSIS — U071 COVID-19: Secondary | ICD-10-CM

## 2020-01-11 MED ORDER — HYDROCHLOROTHIAZIDE 12.5 MG PO TABS
25.0000 mg | ORAL_TABLET | Freq: Every day | ORAL | 3 refills | Status: DC
Start: 2020-01-11 — End: 2020-02-27

## 2020-01-11 NOTE — Patient Instructions (Signed)
It was a pleasure meeting you today and I just want to say again how much I respect you for working on quitting.  Your blood pressure is a little high in the office today so I would like to start you on something for your blood pressure called hydrochlorothiazide. I have sent this to your pharmacy. Please follow up in 4-6 weeks so we can recheck your blood pressure and electrolytes.

## 2020-01-12 DIAGNOSIS — F1121 Opioid dependence, in remission: Secondary | ICD-10-CM | POA: Insufficient documentation

## 2020-01-12 NOTE — Assessment & Plan Note (Signed)
Previously diagnosed with T2DM requiring metformin but more recently has been able to manage with lifestyle modifications.  Plan: ok to repeat A1C in 6-69mo.

## 2020-01-12 NOTE — Assessment & Plan Note (Addendum)
We had a long discussion regarding this topic today. He seems very motivated and seems to have great insight into the long road ahead. He has already modified his enviromental factors. He realizes that successful cessation is multifactorial and is working hard.  Plan: recommend HCV screening at next visit
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
## 2020-01-12 NOTE — Assessment & Plan Note (Signed)
Blood pressure is above goal in the office today. He notes that he has been told he has hypertension in the past. Chart review indicates that he was normotensive during his hospitalization however that was in the setting of acute illness. Plan --initiate hctz 25mg  daily --f/u in 4-6w with BMP

## 2020-01-12 NOTE — Assessment & Plan Note (Signed)
Lungs clear at today's visit. Denies fever, chills, cough, or shortness of breath. Resolved.

## 2020-01-12 NOTE — Assessment & Plan Note (Signed)
Denies pleuritic chest pain or shortness of breath at today's visit. He is complaint with xarelto.  Plan: question 3 vs 6 month therapy. 2019 American college of cardiology, 2019 [facility] [place] [facility], and 2016 CHEST guidelines, treatment for DVT and pulmonary embolisms that are attributable to transient risk factors-- 3 months is favored over shorter or longer courses. He will need follow up at that time to reassess symptoms and to discuss discontinuation.

## 2020-01-16 NOTE — Progress Notes (Signed)
Internal Medicine Clinic Attending  Case discussed with Dr. Christian  At the time of the visit.  We reviewed the resident's history and exam and pertinent patient test results.  I agree with the assessment, diagnosis, and plan of care documented in the resident's note.  

## 2020-02-02 DIAGNOSIS — F1120 Opioid dependence, uncomplicated: Secondary | ICD-10-CM | POA: Diagnosis not present

## 2020-02-04 ENCOUNTER — Encounter: Payer: Self-pay | Admitting: Neurology

## 2020-02-04 ENCOUNTER — Ambulatory Visit (INDEPENDENT_AMBULATORY_CARE_PROVIDER_SITE_OTHER): Payer: BC Managed Care – PPO | Admitting: Neurology

## 2020-02-04 ENCOUNTER — Other Ambulatory Visit: Payer: Self-pay

## 2020-02-04 VITALS — BP 142/94 | HR 64 | Ht 67.0 in | Wt 165.0 lb

## 2020-02-04 DIAGNOSIS — R351 Nocturia: Secondary | ICD-10-CM

## 2020-02-04 DIAGNOSIS — G4700 Insomnia, unspecified: Secondary | ICD-10-CM | POA: Diagnosis not present

## 2020-02-04 DIAGNOSIS — R0683 Snoring: Secondary | ICD-10-CM | POA: Diagnosis not present

## 2020-02-04 DIAGNOSIS — R0681 Apnea, not elsewhere classified: Secondary | ICD-10-CM | POA: Diagnosis not present

## 2020-02-04 NOTE — Patient Instructions (Signed)

## 2020-02-04 NOTE — Progress Notes (Signed)
Subjective:    Patient ID: Tyler Green is a 44 y.o. male.  HPI     Star Age, MD, PhD [facility] [address] [place], [place] [address]  Dear Drs. Bridgett Larsson and Evette Doffing,   I saw your patient, Tyler Green, upon your kind request, in my Sleep clinic today for initial consultation of his sleep disorder, in particular, concern for underlying obstructive sleep apnea.  The patient is unaccompanied today.  As you know, Tyler Green is a 44 year old right-handed gentleman with an underlying medical history of diabetes, back pain, opioid use disorder with recent admission to the hospital in August 2021 for accidental overdose, complicated by pulmonary embolism, pneumonia secondary to COVID-19, multiple rib fractures sustained during CPR, who reports snoring and excessive daytime somnolence as well as witnessed apneas.  I reviewed your office note from 12/18/2019.  He is on methadone.  His Epworth sleepiness score is 9/24, fatigue severity score is 38 out of 63.  He lives with his girlfriend.  He has had trouble going to sleep and staying asleep.  He has tried over-the-counter Benadryl which helps occasionally, he does not take it daily.  Melatonin over-the-counter did not help.  He goes to bed around 10 and rise time is around 6:20 AM.  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  They have 2 cats in the household, no kids.  They do have a TV in the bedroom and sometimes it is on at night on low volume or music.  He reports that his father likely had sleep apnea.  He passed away at age 72.  His mother has trouble sleeping and takes Ambien.  The patient reports that he took a sleep aid from a friend once but had side effects including sleepwalking.  He is currently on Suboxone.  He drinks caffeine in the form of coffee, 2 to 3 cups/day and occasional soda.  He smokes 1 pack/day.  He does not currently drink any alcohol.  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[place])    Heroin abuse ([place])    High cholesterol    Hypertension    IV drug abuse ([facility])    Migraine    Non compliance w medication regimen     His Past Surgical History Is Significant For: Past Surgical History:  Procedure Laterality Date   brain tumor removal     INNER EAR SURGERY      His Family History Is Significant For: Family History  Problem Relation Age of Onset   Diabetes Mother    Diabetes Father    Arrhythmia Father     His Social History Is Significant For: Social History   Socioeconomic History   Marital status: Single    Spouse name: Not on file   Number of children: Not on file   Years of education: Not on file   Highest education level: Not on file  Occupational History   Not on file  Tobacco Use   Smoking status: Current Some Day Smoker    Packs/day: 1.00    Years: 30.00    Pack years: 30.00    Types: Cigarettes   Smokeless tobacco: Never Used  Substance and Sexual Activity   Alcohol use: Not Currently    Alcohol/week: 0.0 standard drinks  Comment: formerly heavy drinker   Drug use: Not Currently    Comment: Heroin use formerly    Sexual activity: Not on file  Other Topics Concern   Not on file  Social History Narrative   Not on file   Social Determinants of Health   Financial Resource Strain:    Difficulty of Paying Living Expenses: Not on file  Food Insecurity:    Worried About [place] in the Last Year: Not on file   Ran Out of Food in the Last Year: Not on file  Transportation Needs:    Lack of Transportation (Medical): Not on file   Lack of Transportation (Non-Medical): Not on file  Physical Activity:    Days of Exercise per Week: Not on file   Minutes of Exercise per Session: Not on file  Stress:    Feeling of Stress : Not on file   Social Connections:    Frequency of Communication with Friends and Family: Not on file   Frequency of Social Gatherings with Friends and Family: Not on file   Attends Religious Services: Not on file   Active Member of Clubs or Organizations: Not on file   Attends Archivist Meetings: Not on file   Marital Status: Not on file    His Allergies Are:  No Known Allergies:   His Current Medications Are:  Outpatient Encounter Medications as of 02/04/2020  Medication Sig   Buprenorphine HCl-Naloxone HCl 8-2 MG FILM Place under the tongue.   hydrochlorothiazide (HYDRODIURIL) 12.5 MG tablet Take 2 tablets (25 mg total) by mouth daily.   rivaroxaban (XARELTO) 20 MG TABS tablet Take 1 tablet (20 mg total) by mouth daily with supper.   Rivaroxaban (XARELTO) 15 MG TABS tablet Take 1 tablet (15 mg total) by mouth 2 (two) times daily with a meal for 13 days.   [DISCONTINUED] atorvastatin (LIPITOR) 40 MG tablet Take 1 tablet (40 mg total) by mouth daily at 6 PM. (Patient not taking: Reported on 07/22/2017)   [DISCONTINUED] lisinopril (PRINIVIL,ZESTRIL) 10 MG tablet Take 1 tablet (10 mg total) by mouth daily. (Patient not taking: Reported on 07/22/2017)   [DISCONTINUED] naproxen (NAPROSYN) 500 MG tablet Take 1 tablet twice daily for rib pain. (Patient not taking: Reported on 12/02/2019)   [DISCONTINUED] nicotine (NICODERM CQ - DOSED IN MG/24 HOURS) 21 mg/24hr patch Place 1 patch (21 mg total) onto the skin daily.   No facility-administered encounter medications on file as of 02/04/2020.  :  Review of Systems:  Out of a complete 14 point review of systems, all are reviewed and negative with the exception of these symptoms as listed below: Review of Systems  Neurological:       Here for sleep consult. No prior sleep study. Pt reports trouble falling and staying asleep. He also reports girlfriend has mention he snores and will gasp for breath during his sleep.  Epworth Sleepiness  Scale 0= would never doze 1= slight chance of dozing 2= moderate chance of dozing 3= high chance of dozing  Sitting and reading:1 Watching TV:2 Sitting inactive in a public place (ex. Theater or meeting):1 As a passenger in a car for an hour without a break:1 Lying down to rest in the afternoon:1 Sitting and talking to someone:1 Sitting quietly after lunch (no alcohol):1 In a car, while stopped in traffic:1 Total:9     Objective:  Neurological Exam  Physical Exam Physical Examination:   Vitals:   02/04/20 1538  BP: (!) 142/94  Pulse: 64  SpO2: 99%    General Examination: The patient is a very pleasant 44 y.o. male in no acute distress. He appears well-developed and well-nourished and adequately groomed.   HEENT: Normocephalic, atraumatic, pupils are equal, round and reactive to light, extraocular tracking is good without limitation to gaze excursion or nystagmus noted. Hearing is grossly intact. Face is symmetric with normal facial animation. Speech is clear with no dysarthria noted. There is no hypophonia. There is no lip, neck/head, jaw or voice tremor. Neck is supple with full range of passive and active motion. There are no carotid bruits on auscultation. Oropharynx exam reveals: mild mouth dryness, adequate dental hygiene and mild airway crowding, due to small airway entry, tonsils are small, Mallampati class II.  Tongue protrudes centrally and palate elevates symmetrically.  Neck circumference is 14 and three-quarter inches.  He has a minimal overbite.  Chest: Clear to auscultation without wheezing, rhonchi or crackles noted.  Heart: S1+S2+0, regular and normal without murmurs, rubs or gallops noted.   Abdomen: Soft, non-tender and non-distended with normal bowel sounds appreciated on auscultation.  Extremities: There is no pitting edema in the distal lower extremities bilaterally.   Skin: Warm and dry without trophic changes noted.   Musculoskeletal: exam reveals  no obvious joint deformities, tenderness or joint swelling or erythema.   Neurologically:  Mental status: The patient is awake, alert and oriented in all 4 spheres. His immediate and remote memory, attention, language skills and fund of knowledge are appropriate. There is no evidence of aphasia, agnosia, apraxia or anomia. Speech is clear with normal prosody and enunciation. Thought process is linear. Mood is normal and affect is normal.  Cranial nerves II - XII are as described above under HEENT exam.  Motor exam: Normal bulk, strength and tone is noted. There is no tremor, Romberg is negative. Fine motor skills and coordination: grossly intact.  Cerebellar testing: No dysmetria or intention tremor. There is no truncal or gait ataxia.  Sensory exam: intact to light touch in the upper and lower extremities.  Gait, station and balance: He stands easily. No veering to one side is noted. No leaning to one side is noted. Posture is age-appropriate and stance is narrow based. Gait shows normal stride length and normal pace. No problems turning are noted. Tandem walk is unremarkable.                Assessment and Plan:   In summary, Tyler Green is a very pleasant 44 y.o.-year old male with an underlying medical history of diabetes, back pain, opioid use disorder with recent admission to the hospital in August 2021 for accidental overdose, complicated by pulmonary embolism, pneumonia secondary to COVID-19, multiple rib fractures sustained during CPR, who presents for evaluation of his sleep disturbance, including concern for underlying sleep disordered breathing or obstructive sleep apnea.  I discussed the diagnosis of OSA, its prognosis and treatment options with the patient today. I explained in particular the risks and ramifications of untreated moderate to severe OSA, especially with respect to developing cardiovascular disease down the Road, including congestive heart failure, difficult to treat  hypertension, cardiac arrhythmias, or stroke. Even type 2 diabetes has, in part, been linked to untreated OSA. Symptoms of untreated OSA include daytime sleepiness, memory problems, mood irritability and mood disorder such as depression and anxiety, lack of energy, as well as recurrent headaches, especially morning headaches. We talked about smoking cessation and trying to maintain a healthy lifestyle in general, as well  as the importance of weight control. We also talked about the importance of good sleep hygiene. I recommended the following at this time: sleep study.   I explained the sleep test procedure to the patient and also outlined possible surgical and non-surgical treatment options of OSA, including the use of a custom-made dental device (which would require a referral to a specialist dentist or oral surgeon), upper airway surgical options, such as traditional UPPP or a novel less invasive surgical option in the form of Inspire hypoglossal nerve stimulation (which would involve a referral to an ENT surgeon). I also explained the CPAP treatment option to the patient, who indicated that he would be willing to try CPAP if the need arises.  I answered all his questions today and the patient was in agreement. I plan to see him back after the sleep study is completed and encouraged him to call with any interim questions, concerns, problems or updates.   Thank you very much for allowing me to participate in the care of this nice patient. If I can be of any further assistance to you please do not hesitate to call me at [phone].  Sincerely,   Star Age, MD, PhD

## 2020-02-07 DIAGNOSIS — Z9089 Acquired absence of other organs: Secondary | ICD-10-CM | POA: Diagnosis not present

## 2020-02-07 DIAGNOSIS — H95191 Other disorders following mastoidectomy, right ear: Secondary | ICD-10-CM | POA: Diagnosis not present

## 2020-02-07 DIAGNOSIS — J343 Hypertrophy of nasal turbinates: Secondary | ICD-10-CM | POA: Diagnosis not present

## 2020-02-07 DIAGNOSIS — F17210 Nicotine dependence, cigarettes, uncomplicated: Secondary | ICD-10-CM | POA: Diagnosis not present

## 2020-02-09 DIAGNOSIS — F1120 Opioid dependence, uncomplicated: Secondary | ICD-10-CM | POA: Diagnosis not present

## 2020-02-16 DIAGNOSIS — F419 Anxiety disorder, unspecified: Secondary | ICD-10-CM | POA: Diagnosis not present

## 2020-02-16 DIAGNOSIS — F1120 Opioid dependence, uncomplicated: Secondary | ICD-10-CM | POA: Diagnosis not present

## 2020-02-23 DIAGNOSIS — F419 Anxiety disorder, unspecified: Secondary | ICD-10-CM | POA: Diagnosis not present

## 2020-02-23 DIAGNOSIS — F1120 Opioid dependence, uncomplicated: Secondary | ICD-10-CM | POA: Diagnosis not present

## 2020-02-23 DIAGNOSIS — F1290 Cannabis use, unspecified, uncomplicated: Secondary | ICD-10-CM | POA: Diagnosis not present

## 2020-02-26 ENCOUNTER — Telehealth: Payer: Self-pay

## 2020-02-26 NOTE — Telephone Encounter (Signed)
We have attempted to call the patient two times to schedule sleep study.  Patient has been unavailable at the phone numbers we have on file and has not returned our calls. If patient calls back we will schedule them for their sleep study.  

## 2020-02-27 ENCOUNTER — Ambulatory Visit (INDEPENDENT_AMBULATORY_CARE_PROVIDER_SITE_OTHER): Payer: BC Managed Care – PPO | Admitting: Student

## 2020-02-27 ENCOUNTER — Other Ambulatory Visit: Payer: Self-pay

## 2020-02-27 ENCOUNTER — Encounter: Payer: Self-pay | Admitting: Student

## 2020-02-27 VITALS — BP 149/85 | HR 66 | Temp 98.2°F | Ht 67.0 in | Wt 167.2 lb

## 2020-02-27 DIAGNOSIS — I2699 Other pulmonary embolism without acute cor pulmonale: Secondary | ICD-10-CM

## 2020-02-27 DIAGNOSIS — Z0000 Encounter for general adult medical examination without abnormal findings: Secondary | ICD-10-CM

## 2020-02-27 DIAGNOSIS — I10 Essential (primary) hypertension: Secondary | ICD-10-CM

## 2020-02-27 DIAGNOSIS — F1121 Opioid dependence, in remission: Secondary | ICD-10-CM | POA: Diagnosis not present

## 2020-02-27 DIAGNOSIS — F17200 Nicotine dependence, unspecified, uncomplicated: Secondary | ICD-10-CM

## 2020-02-27 DIAGNOSIS — E119 Type 2 diabetes mellitus without complications: Secondary | ICD-10-CM

## 2020-02-27 DIAGNOSIS — H7011 Chronic mastoiditis, right ear: Secondary | ICD-10-CM

## 2020-02-27 DIAGNOSIS — N529 Male erectile dysfunction, unspecified: Secondary | ICD-10-CM

## 2020-02-27 DIAGNOSIS — U071 COVID-19: Secondary | ICD-10-CM | POA: Diagnosis not present

## 2020-02-27 MED ORDER — SILDENAFIL CITRATE 50 MG PO TABS: 50.0000 mg | ORAL_TABLET | ORAL | 1 refills | Status: AC | PRN

## 2020-02-27 MED ORDER — HYDROCHLOROTHIAZIDE 25 MG PO TABS
25.0000 mg | ORAL_TABLET | Freq: Every day | ORAL | 3 refills | Status: DC
Start: 2020-02-27 — End: 2020-08-21

## 2020-02-27 NOTE — Assessment & Plan Note (Signed)
Reports this has been ongoing for years with gradual onset.  Has both psychologic and physiologic components in his history.  Does not have spontaneous erections throughout the day nor in the morning, and has problems maintaining erection after achieving one.  Does have multiple risk factors including diabetes and hypertension.  Also notes that he is not often in the mood he states is a lot of stress from his job and home situation at times.  -Sildenafil 50 mg -May consider counseling if sildenafil not effective

## 2020-02-27 NOTE — Assessment & Plan Note (Signed)
Had provoked PE during recent hospitalization due to COVID pneumonia. Has been on Xarelto, will complete 90 day course on 11/9.  No further shortness of breath.  No leg swelling or tenderness.  -discontinue Xarelto after 11/9

## 2020-02-27 NOTE — Assessment & Plan Note (Signed)
Patient reports he has restarted smoking again smoking now 1 pack/day.  Nicotine patches helped labs in the hospital but has had a lot of stress from his job lately.  Also recently quit using heroin and would like to focus on one problem at a time.  -Address at next visit

## 2020-02-27 NOTE — Assessment & Plan Note (Addendum)
BP: (!) 149/85  Started on hctz 25mg  on 01/11/20, but still been taking 12.5 mg daily. Denies chest pain, palpitations, dizziness, headaches, LOC.   -increase hctz to 25 mg daily -BMP today  Addendum: BMP benign

## 2020-02-27 NOTE — Assessment & Plan Note (Signed)
Was seen by ENT on 02/07/20, R ear was cleared of cerumen and wet debris, treated with CASH powder, recommending yearly mastoid cleaning with isntructions to call sooner if symptoms recur.  States symptoms are currently resolved  -Follow-up with ENT

## 2020-02-27 NOTE — Progress Notes (Signed)
   CC: Pulmonary embolism, chronic mastoiditis, diabetes, hypertension, smoking, erectile dysfunction  HPI:  [name] is a 44 y.o. male with history as below presenting for follow-up on the above. Please refer to problem based charting for further details of assessment and plan of current problem and chronic medical conditions.  Past Medical History:  Diagnosis Date  . Back pain   . Diabetes ([place])   . Diabetes mellitus   . 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 Heroin abuse ([place])   . High cholesterol   . Hypertension   . IV drug abuse ([place])   . Migraine   . Non compliance w medication regimen    Review of Systems:   Review of Systems  Constitutional: Negative for chills and fever.  HENT: Negative for ear discharge and ear pain.   Respiratory: Negative for cough and shortness of breath.   Cardiovascular: Negative for chest pain and palpitations.  Gastrointestinal: Negative for abdominal pain, nausea and vomiting.  Genitourinary:       Positive erectile dysfunction  Neurological: Negative for dizziness and loss of consciousness.  All other systems reviewed and are negative.    Physical Exam: Vitals:   02/27/20 1531 02/27/20 1538  BP: (!) 150/90 (!) 149/85  Pulse: 66 66  Temp: 98.2 F (36.8 C)   TempSrc: Oral   SpO2: 100%   Weight: 167 lb 3.2 oz (75.8 kg)   Height: 5\' 7"  (1.702 m)    Constitutional: no acute distress Head: atraumatic ENT: external ears normal Cardiovascular: regular rate and rhythm, normal heart sounds Pulmonary: effort normal, normal breath sounds bilaterally Abdominal: flat, nontender, no rebound tenderness, bowel sounds normal Skin: warm and dry Neurological: alert, no focal deficit Psychiatric: normal mood and affect  Assessment & Plan:   See Encounters Tab for problem based charting.  Patient seen with Dr. Heber [place]

## 2020-02-27 NOTE — Patient Instructions (Signed)
Thank you for allowing [place] to be a part of your care today, it was a pleasure seeing you. We discussed your pulmonary embolism, ENT visit, diabetes, hypertension, smoking, and ED.   I am checking these labs: BMP, HepC screen  I have made these changes to your medications: Hctz 25mg  daily Start sildinafil 50mg  as needed for ED  Send your sildinafil to [facility], please use the provided coupon.  For your blood pressure, check if your current Hctz prescription is 12.5mg  or 25mg . If it is 25mg , call us and I will add another BP medication. If it is 12.5, we will increase the dose to 25mg . So take 2 of the 12.5mg  tablets daily, then pick up your new prescription for 25mg  tablets.  For you pulmonary embolism, you will finish your course of Xarelto on 11/9.  For your ED, try the Sildinafil 50mg . If it is not working, we can try a referral to counseling or another medication.  I would like you to try and cut back on smoking, but we can take it one thing at a time.  Please follow up in 3 months   Thank you, and please call the Internal Medicine Clinic at [phone] if you have any questions.  Best, Dr. Bridgett Larsson

## 2020-02-27 NOTE — Assessment & Plan Note (Signed)
Remains drug-free.  Was started on methadone while hospitalized in August 2021, then increased dose to 20mg  BID.  He is now on Suboxone and followed at [facility].  Is motivated and has great insight. Has made significant modifications to his environment.  -Continue following at [facility] on Suboxone

## 2020-02-27 NOTE — Assessment & Plan Note (Signed)
-  HepC screen collected -Also on COVID vacc.  Patient will consider receiving after 90 days from his infection -flu vacc - Declined -Referred for eye exam

## 2020-02-28 DIAGNOSIS — F1120 Opioid dependence, uncomplicated: Secondary | ICD-10-CM | POA: Diagnosis not present

## 2020-02-28 LAB — BMP8+ANION GAP
Anion Gap: 14 mmol/L (ref 10.0–18.0)
BUN/Creatinine Ratio: 14 (ref 9–20)
BUN: 10 mg/dL (ref 6–24)
CO2: 26 mmol/L (ref 20–29)
Calcium: 9.4 mg/dL (ref 8.7–10.2)
Chloride: 102 mmol/L (ref 96–106)
Creatinine, Ser: 0.74 mg/dL — ABNORMAL LOW (ref 0.76–1.27)
GFR calc Af Amer: 131 mL/min/{1.73_m2} (ref >59)
GFR calc non Af Amer: 113 mL/min/{1.73_m2} (ref >59)
Glucose: 131 mg/dL — ABNORMAL HIGH (ref 65–99)
Potassium: 3.6 mmol/L (ref 3.5–5.2)
Sodium: 142 mmol/L (ref 134–144)

## 2020-02-28 LAB — HEPATITIS C ANTIBODY: Hep C Virus Ab: <0.1 s/co ratio (ref 0.0–0.9)

## 2020-03-01 DIAGNOSIS — F1120 Opioid dependence, uncomplicated: Secondary | ICD-10-CM | POA: Diagnosis not present

## 2020-03-01 DIAGNOSIS — F419 Anxiety disorder, unspecified: Secondary | ICD-10-CM | POA: Diagnosis not present

## 2020-03-01 DIAGNOSIS — F1290 Cannabis use, unspecified, uncomplicated: Secondary | ICD-10-CM | POA: Diagnosis not present

## 2020-03-04 NOTE — Progress Notes (Signed)
Internal Medicine Clinic Attending  I saw and evaluated the patient.  I personally confirmed the key portions of the history and exam documented by Dr. Chen and I reviewed pertinent patient test results.  The assessment, diagnosis, and plan were formulated together and I agree with the documentation in the resident's note.  

## 2020-03-08 DIAGNOSIS — F1120 Opioid dependence, uncomplicated: Secondary | ICD-10-CM | POA: Diagnosis not present

## 2020-03-15 DIAGNOSIS — F1120 Opioid dependence, uncomplicated: Secondary | ICD-10-CM | POA: Diagnosis not present

## 2020-03-29 DIAGNOSIS — F1120 Opioid dependence, uncomplicated: Secondary | ICD-10-CM | POA: Diagnosis not present

## 2020-04-05 DIAGNOSIS — F1120 Opioid dependence, uncomplicated: Secondary | ICD-10-CM | POA: Diagnosis not present

## 2020-04-12 DIAGNOSIS — F1120 Opioid dependence, uncomplicated: Secondary | ICD-10-CM | POA: Diagnosis not present

## 2020-04-17 DIAGNOSIS — F1120 Opioid dependence, uncomplicated: Secondary | ICD-10-CM | POA: Diagnosis not present

## 2020-04-24 DIAGNOSIS — F1120 Opioid dependence, uncomplicated: Secondary | ICD-10-CM | POA: Diagnosis not present

## 2020-05-03 DIAGNOSIS — F1120 Opioid dependence, uncomplicated: Secondary | ICD-10-CM | POA: Diagnosis not present

## 2020-05-03 DIAGNOSIS — F419 Anxiety disorder, unspecified: Secondary | ICD-10-CM | POA: Diagnosis not present

## 2020-05-06 DIAGNOSIS — H2513 Age-related nuclear cataract, bilateral: Secondary | ICD-10-CM | POA: Diagnosis not present

## 2020-05-06 DIAGNOSIS — H04123 Dry eye syndrome of bilateral lacrimal glands: Secondary | ICD-10-CM | POA: Diagnosis not present

## 2020-05-06 DIAGNOSIS — H52201 Unspecified astigmatism, right eye: Secondary | ICD-10-CM | POA: Diagnosis not present

## 2020-05-06 DIAGNOSIS — H5213 Myopia, bilateral: Secondary | ICD-10-CM | POA: Diagnosis not present

## 2020-05-06 DIAGNOSIS — E113393 Type 2 diabetes mellitus with moderate nonproliferative diabetic retinopathy without macular edema, bilateral: Secondary | ICD-10-CM | POA: Diagnosis not present

## 2020-05-06 LAB — HM DIABETES EYE EXAM

## 2020-05-13 DIAGNOSIS — F1120 Opioid dependence, uncomplicated: Secondary | ICD-10-CM | POA: Diagnosis not present

## 2020-05-17 DIAGNOSIS — F419 Anxiety disorder, unspecified: Secondary | ICD-10-CM | POA: Diagnosis not present

## 2020-05-17 DIAGNOSIS — F1120 Opioid dependence, uncomplicated: Secondary | ICD-10-CM | POA: Diagnosis not present

## 2020-05-24 DIAGNOSIS — F419 Anxiety disorder, unspecified: Secondary | ICD-10-CM | POA: Diagnosis not present

## 2020-05-24 DIAGNOSIS — F1120 Opioid dependence, uncomplicated: Secondary | ICD-10-CM | POA: Diagnosis not present

## 2020-05-29 DIAGNOSIS — F1120 Opioid dependence, uncomplicated: Secondary | ICD-10-CM | POA: Diagnosis not present

## 2020-06-02 ENCOUNTER — Encounter: Payer: Self-pay | Admitting: *Deleted

## 2020-06-11 DIAGNOSIS — F1120 Opioid dependence, uncomplicated: Secondary | ICD-10-CM | POA: Diagnosis not present

## 2020-06-21 DIAGNOSIS — F419 Anxiety disorder, unspecified: Secondary | ICD-10-CM | POA: Diagnosis not present

## 2020-06-21 DIAGNOSIS — F1290 Cannabis use, unspecified, uncomplicated: Secondary | ICD-10-CM | POA: Diagnosis not present

## 2020-06-21 DIAGNOSIS — G4709 Other insomnia: Secondary | ICD-10-CM | POA: Diagnosis not present

## 2020-06-21 DIAGNOSIS — F1120 Opioid dependence, uncomplicated: Secondary | ICD-10-CM | POA: Diagnosis not present

## 2020-07-05 DIAGNOSIS — F419 Anxiety disorder, unspecified: Secondary | ICD-10-CM | POA: Diagnosis not present

## 2020-07-05 DIAGNOSIS — G4709 Other insomnia: Secondary | ICD-10-CM | POA: Diagnosis not present

## 2020-07-05 DIAGNOSIS — F1120 Opioid dependence, uncomplicated: Secondary | ICD-10-CM | POA: Diagnosis not present

## 2020-07-05 DIAGNOSIS — F1290 Cannabis use, unspecified, uncomplicated: Secondary | ICD-10-CM | POA: Diagnosis not present

## 2020-07-19 DIAGNOSIS — F419 Anxiety disorder, unspecified: Secondary | ICD-10-CM | POA: Diagnosis not present

## 2020-07-19 DIAGNOSIS — F1120 Opioid dependence, uncomplicated: Secondary | ICD-10-CM | POA: Diagnosis not present

## 2020-07-19 DIAGNOSIS — G4709 Other insomnia: Secondary | ICD-10-CM | POA: Diagnosis not present

## 2020-07-19 DIAGNOSIS — F1290 Cannabis use, unspecified, uncomplicated: Secondary | ICD-10-CM | POA: Diagnosis not present

## 2020-07-23 DIAGNOSIS — F1120 Opioid dependence, uncomplicated: Secondary | ICD-10-CM | POA: Diagnosis not present

## 2020-08-02 DIAGNOSIS — F1120 Opioid dependence, uncomplicated: Secondary | ICD-10-CM | POA: Diagnosis not present

## 2020-08-02 DIAGNOSIS — G4709 Other insomnia: Secondary | ICD-10-CM | POA: Diagnosis not present

## 2020-08-02 DIAGNOSIS — F419 Anxiety disorder, unspecified: Secondary | ICD-10-CM | POA: Diagnosis not present

## 2020-08-09 DIAGNOSIS — F329 Major depressive disorder, single episode, unspecified: Secondary | ICD-10-CM | POA: Diagnosis not present

## 2020-08-09 DIAGNOSIS — F1120 Opioid dependence, uncomplicated: Secondary | ICD-10-CM | POA: Diagnosis not present

## 2020-08-09 DIAGNOSIS — F1910 Other psychoactive substance abuse, uncomplicated: Secondary | ICD-10-CM | POA: Diagnosis not present

## 2020-08-16 DIAGNOSIS — F419 Anxiety disorder, unspecified: Secondary | ICD-10-CM | POA: Diagnosis not present

## 2020-08-16 DIAGNOSIS — G4709 Other insomnia: Secondary | ICD-10-CM | POA: Diagnosis not present

## 2020-08-16 DIAGNOSIS — F329 Major depressive disorder, single episode, unspecified: Secondary | ICD-10-CM | POA: Diagnosis not present

## 2020-08-16 DIAGNOSIS — F1120 Opioid dependence, uncomplicated: Secondary | ICD-10-CM | POA: Diagnosis not present

## 2020-08-21 ENCOUNTER — Encounter: Payer: Self-pay | Admitting: Internal Medicine

## 2020-08-21 ENCOUNTER — Ambulatory Visit (INDEPENDENT_AMBULATORY_CARE_PROVIDER_SITE_OTHER): Payer: BC Managed Care – PPO | Admitting: Internal Medicine

## 2020-08-21 ENCOUNTER — Other Ambulatory Visit: Payer: Self-pay

## 2020-08-21 VITALS — BP 152/93 | HR 73 | Temp 98.2°F | Ht 67.0 in | Wt 165.0 lb

## 2020-08-21 DIAGNOSIS — E119 Type 2 diabetes mellitus without complications: Secondary | ICD-10-CM | POA: Diagnosis not present

## 2020-08-21 DIAGNOSIS — F419 Anxiety disorder, unspecified: Secondary | ICD-10-CM

## 2020-08-21 DIAGNOSIS — I10 Essential (primary) hypertension: Secondary | ICD-10-CM | POA: Diagnosis not present

## 2020-08-21 DIAGNOSIS — F4321 Adjustment disorder with depressed mood: Secondary | ICD-10-CM | POA: Diagnosis not present

## 2020-08-21 MED ORDER — HYDROCHLOROTHIAZIDE 25 MG PO TABS: 25.0000 mg | ORAL_TABLET | Freq: Every day | ORAL | 3 refills | Status: DC

## 2020-08-21 MED ORDER — HYDROXYZINE PAMOATE 25 MG PO CAPS: 25.0000 mg | ORAL_CAPSULE | Freq: Three times a day (TID) | ORAL | 0 refills | Status: DC | PRN

## 2020-08-21 MED ORDER — HYDROXYZINE HCL 25 MG PO TABS: 25.0000 mg | ORAL_TABLET | Freq: Three times a day (TID) | ORAL | 0 refills | Status: AC | PRN

## 2020-08-21 NOTE — Progress Notes (Signed)
   CC: HTN  HPI:  [name] is a 45 y.o. with a past medical history listed below presenting for evaluation of his hypertension. For details of today's visit and the status of his chronic medical issues please refer to the assessment and plan.   Past Medical History:  Diagnosis Date  . Back pain   . Diabetes ([place])   . Diabetes mellitus   . 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 Heroin abuse ([place])   . High cholesterol   . Hypertension   . IV drug abuse ([place])   . Migraine   . Non compliance w medication regimen    Review of Systems:   Review of Systems  Constitutional: Negative for chills and fever.  Respiratory: Negative for cough and shortness of breath.   Cardiovascular: Negative for chest pain, palpitations and leg swelling.  Gastrointestinal: Negative for abdominal pain, nausea and vomiting.  Psychiatric/Behavioral: Positive for depression. Negative for suicidal ideas. The patient is nervous/anxious.      Physical Exam:  Vitals:   08/21/20 1315  BP: (!) 152/93  Pulse: 73  Temp: 98.2 F (36.8 C)  TempSrc: Oral  SpO2: 100%  Weight: 165 lb (74.8 kg)  Height: 5\' 7"  (1.702 m)   Physical Exam Vitals reviewed.  Constitutional:      Appearance: Normal appearance.  Cardiovascular:     Rate and Rhythm: Normal rate and regular rhythm.     Pulses: Normal pulses.     Heart sounds: Normal heart sounds. No murmur heard. No friction rub. No gallop.   Pulmonary:     Effort: Pulmonary effort is normal. No respiratory distress.     Breath sounds: Normal breath sounds. No wheezing or rales.  Abdominal:     General: Abdomen is flat. Bowel sounds are normal. There is no distension.     Palpations: Abdomen is soft.     Tenderness: There is no abdominal tenderness. There is no guarding.  Musculoskeletal:        General: No swelling or tenderness.     Right lower leg: No edema.     Left lower leg: No edema.  Neurological:     Mental Status: He is alert and oriented  to person, place, and time.  Psychiatric:        Mood and Affect: Mood normal.        Behavior: Behavior normal.        Thought Content: Thought content normal.        Judgment: Judgment normal.     Assessment & Plan:   See Encounters Tab for problem based charting.  Patient discussed with Dr. Jimmye Norman

## 2020-08-21 NOTE — Patient Instructions (Signed)
Thank you for allowing [place] to provide your care today. Today we discussed your anxiety and hypertension.  Today we made changes to your medications. I sent a refill for your hydrochlorothiazide 25 mg daily.  For anxiety I sent in a prescription of hydroxyzine (Atarax) 25 mg 3 times daily as needed.  For your loss, I want you to know a great resource in town with a [place].  They provide free grief counseling, their number is [phone].  You can look up more information at the following website-  [email or web address]://[email or web address]  Please follow-up in 4 to 6 weeks for blood pressure check and follow-up on her anxiety and depression.    Should you have any questions or concerns please call the internal medicine clinic at [phone].

## 2020-08-21 NOTE — Assessment & Plan Note (Addendum)
BP Readings from Last 3 Encounters:  08/21/20 (!) 152/93  02/27/20 (!) 149/85  02/04/20 (!) 142/94   Blood pressure above goal today.  Patient states that he has been out of his hydrochlorothiazide.  He has not been taking the 25 mg discussed at his last visit.  Given his history of uncontrolled hypertension on 12.5 mg of hydrochlorothiazide I think it is appropriate to go ahead and restart 25 mg once daily.  Since he has been off this medication will resume and follow-up in 4 weeks and at that time check a BMP.  Plan: Restart hydrochlorothiazide 25 mg daily Recheck BP in 4 weeks and check BMP at follow-up visit

## 2020-08-22 DIAGNOSIS — F419 Anxiety disorder, unspecified: Secondary | ICD-10-CM | POA: Insufficient documentation

## 2020-08-22 DIAGNOSIS — F4321 Adjustment disorder with depressed mood: Secondary | ICD-10-CM | POA: Insufficient documentation

## 2020-08-22 NOTE — Assessment & Plan Note (Signed)
Patient is due for urine microalbumin and repeat hemoglobin A1c.  Plan to check BMP at next visit we will obtain these labs at this time.

## 2020-08-22 NOTE — Assessment & Plan Note (Signed)
Patient reports that his wife passed away about 1 month ago.  He is currently grieving.  States that he has felt depressed and more anxious.  States he has a good support system here as well as good coworkers.  He states in the past he has been treated for anxiety and depression with medications.  States that he did not like how he felt on these medications mostly due to the drowsiness and does not want any medications at this time.  Discussed support through a [facility] grief counseling as well as counseling through our clinic.  Patient is not interested at this time however does want treatment for his anxiety.  See anxiety tab above.  Plan: Continue to monitor Provided resources for free grieving counselor

## 2020-08-22 NOTE — Assessment & Plan Note (Signed)
Patient endorses a longstanding history of anxiety which has worsened recently due to the loss of his wife.  States in the past he was treated with SSRIs however did not like how he felt on these medications.  He also states that he occasionally took his wife's Xanax when he felt extremely overwhelmed.  He is requesting a Xanax prescription.  Discussed since he is on Suboxone would probably have to go through [facility] for any other controlled substance prescriptions.  Also discussed that I would not recommend a Xanax prescription for treatment of his anxiety.  Discussed the risks, mostly of addiction.  Recommended that he try an SSRI and in the meantime also something like hydroxyzine or buspirone for acute treatment.  He is open to this.  Plan: Start hydroxyzine 25 mg 3 times daily as needed for anxiety

## 2020-08-28 NOTE — Progress Notes (Signed)
Internal Medicine Clinic Attending ° °Case discussed with Dr. Rehman  At the time of the visit.  We reviewed the resident’s history and exam and pertinent patient test results.  I agree with the assessment, diagnosis, and plan of care documented in the resident’s note.  ° °

## 2020-09-11 DIAGNOSIS — F329 Major depressive disorder, single episode, unspecified: Secondary | ICD-10-CM | POA: Diagnosis not present

## 2020-09-11 DIAGNOSIS — F419 Anxiety disorder, unspecified: Secondary | ICD-10-CM | POA: Diagnosis not present

## 2020-09-13 DIAGNOSIS — F1120 Opioid dependence, uncomplicated: Secondary | ICD-10-CM | POA: Diagnosis not present

## 2020-09-13 DIAGNOSIS — F419 Anxiety disorder, unspecified: Secondary | ICD-10-CM | POA: Diagnosis not present

## 2020-09-13 DIAGNOSIS — G4709 Other insomnia: Secondary | ICD-10-CM | POA: Diagnosis not present

## 2020-09-25 DIAGNOSIS — F1120 Opioid dependence, uncomplicated: Secondary | ICD-10-CM | POA: Diagnosis not present

## 2020-09-25 DIAGNOSIS — F4323 Adjustment disorder with mixed anxiety and depressed mood: Secondary | ICD-10-CM | POA: Diagnosis not present

## 2020-10-02 ENCOUNTER — Encounter: Payer: BC Managed Care – PPO | Admitting: Internal Medicine

## 2020-10-02 ENCOUNTER — Telehealth: Payer: Self-pay

## 2020-10-02 ENCOUNTER — Encounter: Payer: Self-pay | Admitting: Student

## 2020-10-02 NOTE — Telephone Encounter (Signed)
Pt was called for his missed appt and left a message on house number

## 2020-10-08 ENCOUNTER — Encounter: Payer: Self-pay | Admitting: *Deleted

## 2020-10-11 DIAGNOSIS — G4709 Other insomnia: Secondary | ICD-10-CM | POA: Diagnosis not present

## 2020-10-11 DIAGNOSIS — F4320 Adjustment disorder, unspecified: Secondary | ICD-10-CM | POA: Diagnosis not present

## 2020-10-11 DIAGNOSIS — F1120 Opioid dependence, uncomplicated: Secondary | ICD-10-CM | POA: Diagnosis not present

## 2020-10-11 DIAGNOSIS — F419 Anxiety disorder, unspecified: Secondary | ICD-10-CM | POA: Diagnosis not present

## 2020-10-21 DIAGNOSIS — F4323 Adjustment disorder with mixed anxiety and depressed mood: Secondary | ICD-10-CM | POA: Diagnosis not present

## 2020-10-21 DIAGNOSIS — F1120 Opioid dependence, uncomplicated: Secondary | ICD-10-CM | POA: Diagnosis not present

## 2020-10-21 DIAGNOSIS — G4709 Other insomnia: Secondary | ICD-10-CM | POA: Diagnosis not present

## 2020-11-01 DIAGNOSIS — F329 Major depressive disorder, single episode, unspecified: Secondary | ICD-10-CM | POA: Diagnosis not present

## 2020-11-01 DIAGNOSIS — F1120 Opioid dependence, uncomplicated: Secondary | ICD-10-CM | POA: Diagnosis not present

## 2020-11-29 DIAGNOSIS — F1120 Opioid dependence, uncomplicated: Secondary | ICD-10-CM | POA: Diagnosis not present

## 2020-11-29 DIAGNOSIS — F419 Anxiety disorder, unspecified: Secondary | ICD-10-CM | POA: Diagnosis not present

## 2020-12-02 DIAGNOSIS — F4323 Adjustment disorder with mixed anxiety and depressed mood: Secondary | ICD-10-CM | POA: Diagnosis not present

## 2020-12-02 DIAGNOSIS — F1120 Opioid dependence, uncomplicated: Secondary | ICD-10-CM | POA: Diagnosis not present

## 2020-12-02 DIAGNOSIS — G4709 Other insomnia: Secondary | ICD-10-CM | POA: Diagnosis not present

## 2020-12-02 DIAGNOSIS — F419 Anxiety disorder, unspecified: Secondary | ICD-10-CM | POA: Diagnosis not present

## 2020-12-03 DIAGNOSIS — F419 Anxiety disorder, unspecified: Secondary | ICD-10-CM | POA: Diagnosis not present

## 2020-12-03 DIAGNOSIS — F1910 Other psychoactive substance abuse, uncomplicated: Secondary | ICD-10-CM | POA: Diagnosis not present

## 2020-12-03 DIAGNOSIS — F4323 Adjustment disorder with mixed anxiety and depressed mood: Secondary | ICD-10-CM | POA: Diagnosis not present

## 2020-12-03 DIAGNOSIS — F1120 Opioid dependence, uncomplicated: Secondary | ICD-10-CM | POA: Diagnosis not present

## 2020-12-27 DIAGNOSIS — F419 Anxiety disorder, unspecified: Secondary | ICD-10-CM | POA: Diagnosis not present

## 2020-12-27 DIAGNOSIS — F1120 Opioid dependence, uncomplicated: Secondary | ICD-10-CM | POA: Diagnosis not present

## 2021-01-14 DIAGNOSIS — F1910 Other psychoactive substance abuse, uncomplicated: Secondary | ICD-10-CM | POA: Diagnosis not present

## 2021-01-14 DIAGNOSIS — F4323 Adjustment disorder with mixed anxiety and depressed mood: Secondary | ICD-10-CM | POA: Diagnosis not present

## 2021-01-14 DIAGNOSIS — F419 Anxiety disorder, unspecified: Secondary | ICD-10-CM | POA: Diagnosis not present

## 2021-01-14 DIAGNOSIS — F1120 Opioid dependence, uncomplicated: Secondary | ICD-10-CM | POA: Diagnosis not present

## 2021-01-24 DIAGNOSIS — F419 Anxiety disorder, unspecified: Secondary | ICD-10-CM | POA: Diagnosis not present

## 2021-01-24 DIAGNOSIS — F1120 Opioid dependence, uncomplicated: Secondary | ICD-10-CM | POA: Diagnosis not present

## 2021-01-28 DIAGNOSIS — F1120 Opioid dependence, uncomplicated: Secondary | ICD-10-CM | POA: Diagnosis not present

## 2021-01-28 DIAGNOSIS — G4709 Other insomnia: Secondary | ICD-10-CM | POA: Diagnosis not present

## 2021-01-28 DIAGNOSIS — F419 Anxiety disorder, unspecified: Secondary | ICD-10-CM | POA: Diagnosis not present

## 2021-01-28 DIAGNOSIS — F1910 Other psychoactive substance abuse, uncomplicated: Secondary | ICD-10-CM | POA: Diagnosis not present

## 2021-01-28 DIAGNOSIS — F4323 Adjustment disorder with mixed anxiety and depressed mood: Secondary | ICD-10-CM | POA: Diagnosis not present

## 2021-02-21 DIAGNOSIS — F419 Anxiety disorder, unspecified: Secondary | ICD-10-CM | POA: Diagnosis not present

## 2021-02-21 DIAGNOSIS — F1120 Opioid dependence, uncomplicated: Secondary | ICD-10-CM | POA: Diagnosis not present

## 2021-02-24 DIAGNOSIS — F1120 Opioid dependence, uncomplicated: Secondary | ICD-10-CM | POA: Diagnosis not present

## 2021-02-24 DIAGNOSIS — F4323 Adjustment disorder with mixed anxiety and depressed mood: Secondary | ICD-10-CM | POA: Diagnosis not present

## 2021-02-24 DIAGNOSIS — F419 Anxiety disorder, unspecified: Secondary | ICD-10-CM | POA: Diagnosis not present

## 2021-02-24 DIAGNOSIS — G4709 Other insomnia: Secondary | ICD-10-CM | POA: Diagnosis not present

## 2021-03-06 ENCOUNTER — Other Ambulatory Visit: Payer: Self-pay | Admitting: Internal Medicine

## 2021-03-06 DIAGNOSIS — I10 Essential (primary) hypertension: Secondary | ICD-10-CM

## 2021-03-06 NOTE — Telephone Encounter (Signed)
Will refill, can we please schedule patient an appointment to be seen for blood pressure recheck and lab work? Thank you!

## 2021-03-09 ENCOUNTER — Encounter: Payer: Self-pay | Admitting: Internal Medicine

## 2021-03-21 DIAGNOSIS — F419 Anxiety disorder, unspecified: Secondary | ICD-10-CM | POA: Diagnosis not present

## 2021-03-21 DIAGNOSIS — G4709 Other insomnia: Secondary | ICD-10-CM | POA: Diagnosis not present

## 2021-03-21 DIAGNOSIS — F1120 Opioid dependence, uncomplicated: Secondary | ICD-10-CM | POA: Diagnosis not present

## 2021-03-30 DIAGNOSIS — F1120 Opioid dependence, uncomplicated: Secondary | ICD-10-CM | POA: Diagnosis not present

## 2021-03-30 DIAGNOSIS — F4323 Adjustment disorder with mixed anxiety and depressed mood: Secondary | ICD-10-CM | POA: Diagnosis not present

## 2021-03-30 DIAGNOSIS — F419 Anxiety disorder, unspecified: Secondary | ICD-10-CM | POA: Diagnosis not present

## 2021-03-30 DIAGNOSIS — G4709 Other insomnia: Secondary | ICD-10-CM | POA: Diagnosis not present

## 2021-04-07 DIAGNOSIS — F1120 Opioid dependence, uncomplicated: Secondary | ICD-10-CM | POA: Diagnosis not present

## 2021-04-07 DIAGNOSIS — F4323 Adjustment disorder with mixed anxiety and depressed mood: Secondary | ICD-10-CM | POA: Diagnosis not present

## 2021-04-07 DIAGNOSIS — F419 Anxiety disorder, unspecified: Secondary | ICD-10-CM | POA: Diagnosis not present

## 2021-04-07 DIAGNOSIS — G4709 Other insomnia: Secondary | ICD-10-CM | POA: Diagnosis not present

## 2021-04-11 DIAGNOSIS — F4323 Adjustment disorder with mixed anxiety and depressed mood: Secondary | ICD-10-CM | POA: Diagnosis not present

## 2021-04-11 DIAGNOSIS — F1120 Opioid dependence, uncomplicated: Secondary | ICD-10-CM | POA: Diagnosis not present

## 2021-04-11 DIAGNOSIS — G4709 Other insomnia: Secondary | ICD-10-CM | POA: Diagnosis not present

## 2021-04-11 DIAGNOSIS — I10 Essential (primary) hypertension: Secondary | ICD-10-CM | POA: Diagnosis not present

## 2021-05-08 DIAGNOSIS — F419 Anxiety disorder, unspecified: Secondary | ICD-10-CM | POA: Diagnosis not present

## 2021-05-08 DIAGNOSIS — F4323 Adjustment disorder with mixed anxiety and depressed mood: Secondary | ICD-10-CM | POA: Diagnosis not present

## 2021-05-08 DIAGNOSIS — F1120 Opioid dependence, uncomplicated: Secondary | ICD-10-CM | POA: Diagnosis not present

## 2021-05-08 DIAGNOSIS — G4709 Other insomnia: Secondary | ICD-10-CM | POA: Diagnosis not present

## 2021-05-23 DIAGNOSIS — F4323 Adjustment disorder with mixed anxiety and depressed mood: Secondary | ICD-10-CM | POA: Diagnosis not present

## 2021-05-23 DIAGNOSIS — G4709 Other insomnia: Secondary | ICD-10-CM | POA: Diagnosis not present

## 2021-05-23 DIAGNOSIS — F1120 Opioid dependence, uncomplicated: Secondary | ICD-10-CM | POA: Diagnosis not present

## 2021-05-23 DIAGNOSIS — I10 Essential (primary) hypertension: Secondary | ICD-10-CM | POA: Diagnosis not present

## 2021-06-05 DIAGNOSIS — F1120 Opioid dependence, uncomplicated: Secondary | ICD-10-CM | POA: Diagnosis not present

## 2021-06-05 DIAGNOSIS — F419 Anxiety disorder, unspecified: Secondary | ICD-10-CM | POA: Diagnosis not present

## 2021-06-05 DIAGNOSIS — G4709 Other insomnia: Secondary | ICD-10-CM | POA: Diagnosis not present

## 2021-06-05 DIAGNOSIS — F4323 Adjustment disorder with mixed anxiety and depressed mood: Secondary | ICD-10-CM | POA: Diagnosis not present

## 2021-06-17 ENCOUNTER — Other Ambulatory Visit: Payer: Self-pay | Admitting: Student

## 2021-06-17 DIAGNOSIS — I10 Essential (primary) hypertension: Secondary | ICD-10-CM

## 2021-06-22 ENCOUNTER — Encounter: Payer: Self-pay | Admitting: Internal Medicine

## 2021-06-23 DIAGNOSIS — F1120 Opioid dependence, uncomplicated: Secondary | ICD-10-CM | POA: Diagnosis not present

## 2021-07-03 DIAGNOSIS — G4709 Other insomnia: Secondary | ICD-10-CM | POA: Diagnosis not present

## 2021-07-03 DIAGNOSIS — F419 Anxiety disorder, unspecified: Secondary | ICD-10-CM | POA: Diagnosis not present

## 2021-07-03 DIAGNOSIS — F1120 Opioid dependence, uncomplicated: Secondary | ICD-10-CM | POA: Diagnosis not present

## 2021-07-03 DIAGNOSIS — F4323 Adjustment disorder with mixed anxiety and depressed mood: Secondary | ICD-10-CM | POA: Diagnosis not present

## 2021-07-07 DIAGNOSIS — F1120 Opioid dependence, uncomplicated: Secondary | ICD-10-CM | POA: Diagnosis not present

## 2021-07-14 ENCOUNTER — Other Ambulatory Visit: Payer: Self-pay | Admitting: Internal Medicine

## 2021-07-14 DIAGNOSIS — I10 Essential (primary) hypertension: Secondary | ICD-10-CM

## 2021-07-14 NOTE — Telephone Encounter (Signed)
Called pt to schedule an appt - no answer and no vm;  home # no longer in service. Also called significant other on pt's chart, no answer and no vm. ?

## 2021-07-15 NOTE — Telephone Encounter (Signed)
Please schedule pt an appt -can a letter be mailed to his home? Thanks ?

## 2021-07-20 DIAGNOSIS — F1120 Opioid dependence, uncomplicated: Secondary | ICD-10-CM | POA: Diagnosis not present

## 2021-07-22 ENCOUNTER — Encounter: Payer: Self-pay | Admitting: Internal Medicine

## 2021-08-07 DIAGNOSIS — G4709 Other insomnia: Secondary | ICD-10-CM | POA: Diagnosis not present

## 2021-08-07 DIAGNOSIS — F1120 Opioid dependence, uncomplicated: Secondary | ICD-10-CM | POA: Diagnosis not present

## 2021-08-07 DIAGNOSIS — F4323 Adjustment disorder with mixed anxiety and depressed mood: Secondary | ICD-10-CM | POA: Diagnosis not present

## 2021-08-07 DIAGNOSIS — F419 Anxiety disorder, unspecified: Secondary | ICD-10-CM | POA: Diagnosis not present

## 2021-08-12 ENCOUNTER — Encounter: Payer: BC Managed Care – PPO | Admitting: Internal Medicine

## 2021-08-12 ENCOUNTER — Encounter: Payer: Self-pay | Admitting: Internal Medicine

## 2021-08-12 ENCOUNTER — Other Ambulatory Visit: Payer: Self-pay | Admitting: Internal Medicine

## 2021-08-12 DIAGNOSIS — I10 Essential (primary) hypertension: Secondary | ICD-10-CM

## 2021-08-17 DIAGNOSIS — F1120 Opioid dependence, uncomplicated: Secondary | ICD-10-CM | POA: Diagnosis not present

## 2021-09-14 ENCOUNTER — Other Ambulatory Visit: Payer: Self-pay | Admitting: Internal Medicine

## 2021-09-14 DIAGNOSIS — I10 Essential (primary) hypertension: Secondary | ICD-10-CM

## 2022-01-01 ENCOUNTER — Encounter: Payer: BC Managed Care – PPO | Admitting: Internal Medicine

## 2022-01-01 NOTE — Progress Notes (Deleted)
   CC: follow up  HPI:  [name] is a 46 y.o. with medical history of HTN, HLD, DMII, GAD, Tobacco use disorder presenting to [facility] for a follow up.   Please see problem-based list for further details, assessments, and plans.  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 No evidence for RIGHT heart strain." Considered provoked secondary to COVID infection     Current Outpatient Medications (Cardiovascular):    hydrochlorothiazide (HYDRODIURIL) 25 MG tablet, TAKE 1 TABLET (25 MG TOTAL) BY MOUTH DAILY.   sildenafil (VIAGRA) 50 MG tablet, Take 1 tablet (50 mg total) by mouth as needed for erectile dysfunction.   Current Outpatient Medications (Analgesics):    Buprenorphine HCl-Naloxone HCl 8-2 MG FILM, Place under the tongue.    Review of Systems:  Review of system negative unless stated in the problem list or HPI.    Physical Exam:  There were no vitals filed for this visit.  Physical Exam General: NAD HENT: NCAT Lungs: CTAB, no wheeze, rhonchi or rales.  Cardiovascular: Normal heart sounds, no r/m/g, 2+ pulses in all extremities. No LE edema Abdomen: No TTP, normal bowel sounds MSK: No asymmetry or muscle atrophy.  Skin: no lesions noted on exposed skin Neuro: Alert and oriented x4. CN grossly intact Psych: Normal mood and normal affect   Assessment & Plan:   No problem-specific Assessment & Plan notes found for this encounter.   See Encounters Tab for problem based charting.  Patient discussed with Dr.  {NAMES:[id number]::"Guilloud","Hoffman","Mullen","Narendra","Vincent","Machen","Lau","Hatcher"} Idamae Schuller, MD Tillie Rung. [facility] Internal Medicine Residency, PGY-2

## 2022-03-25 ENCOUNTER — Telehealth: Payer: Self-pay | Admitting: Internal Medicine

## 2022-03-25 ENCOUNTER — Encounter: Payer: Self-pay | Admitting: Internal Medicine

## 2022-03-25 NOTE — Telephone Encounter (Signed)
-----   Message from Dorethea Clan, DO sent at 03/23/2022  9:41 AM EST ----- Regarding: appt Hi! I know we've tried in the past, but can we call this patient to get him an appt with me this block to discuss his blood pressure?  Thanks!

## 2022-03-25 NOTE — Telephone Encounter (Signed)
Please refer to message below.  Attempted to contact patient to schedule future appointment.  Phone number on file just rings and no voicemail.  Will send letter to current address on file asking patient to contact our office to schedule future appointment with PCP.

## 2022-05-27 DEATH — deceased

## 2022-06-28 ENCOUNTER — Telehealth: Payer: Self-pay | Admitting: Dietician

## 2022-06-28 NOTE — Telephone Encounter (Signed)
Calling to see if patient would like to continue care at our office. Unable to leave message.
